# Patient Record
Sex: Male | Born: 1965 | Race: White | Hispanic: No | Marital: Married | Smoking: Current every day smoker
Health system: Southern US, Community
[De-identification: ages and names within clinical notes are randomized; demographics above are authoritative.]

## PROBLEM LIST (undated history)

## (undated) DIAGNOSIS — K219 Gastro-esophageal reflux disease without esophagitis: Secondary | ICD-10-CM

## (undated) DIAGNOSIS — R51 Headache: Secondary | ICD-10-CM

## (undated) DIAGNOSIS — T7840XA Allergy, unspecified, initial encounter: Secondary | ICD-10-CM

## (undated) DIAGNOSIS — R0601 Orthopnea: Secondary | ICD-10-CM

## (undated) HISTORY — PX: ESOPHAGOGASTRODUODENOSCOPY: SHX1529

## (undated) HISTORY — PX: TONSILLECTOMY AND ADENOIDECTOMY: SUR1326

## (undated) HISTORY — DX: Allergy, unspecified, initial encounter: T78.40XA

---

## 2001-12-25 ENCOUNTER — Emergency Department (HOSPITAL_COMMUNITY): Admission: EM | Admit: 2001-12-25 | Discharge: 2001-12-25 | Payer: Self-pay | Admitting: Emergency Medicine

## 2001-12-25 ENCOUNTER — Encounter: Payer: Self-pay | Admitting: Emergency Medicine

## 2004-05-14 ENCOUNTER — Encounter: Admission: RE | Admit: 2004-05-14 | Discharge: 2004-05-14 | Payer: Self-pay | Admitting: Family Medicine

## 2013-09-08 ENCOUNTER — Ambulatory Visit (INDEPENDENT_AMBULATORY_CARE_PROVIDER_SITE_OTHER): Payer: BC Managed Care – PPO | Admitting: Emergency Medicine

## 2013-09-08 VITALS — BP 110/70 | HR 66 | Temp 98.0°F | Resp 18 | Ht 67.0 in | Wt 220.0 lb

## 2013-09-08 DIAGNOSIS — R05 Cough: Secondary | ICD-10-CM

## 2013-09-08 DIAGNOSIS — R059 Cough, unspecified: Secondary | ICD-10-CM

## 2013-09-08 DIAGNOSIS — R52 Pain, unspecified: Secondary | ICD-10-CM

## 2013-09-08 DIAGNOSIS — J329 Chronic sinusitis, unspecified: Secondary | ICD-10-CM

## 2013-09-08 DIAGNOSIS — J029 Acute pharyngitis, unspecified: Secondary | ICD-10-CM

## 2013-09-08 LAB — POCT INFLUENZA A/B
Influenza A, POC: NEGATIVE
Influenza B, POC: NEGATIVE

## 2013-09-08 LAB — POCT RAPID STREP A (OFFICE): Rapid Strep A Screen: NEGATIVE

## 2013-09-08 MED ORDER — FLUTICASONE PROPIONATE 50 MCG/ACT NA SUSP: 2.0000 | Freq: Every day | NASAL | Status: DC

## 2013-09-08 MED ORDER — AZITHROMYCIN 250 MG PO TABS: ORAL_TABLET | ORAL | Status: DC

## 2013-09-08 NOTE — Patient Instructions (Signed)

## 2013-09-08 NOTE — Progress Notes (Signed)
   Subjective:    Patient ID: Barry Jackson, male    DOB: 21-Jul-1966, 47 y.o.   MRN: [id number]  HPI  Patient presents today with sinus congestion, sore throat. He uses the saline nasal spray OTC. He is have problems when he is sleeping. He has had chills last night. No coughing he has a history of sinusitis. The symptoms all began 2 days ago.    Review of Systems     Objective:   Physical Exam patient is alert and cooperative. He is ill-appearing but not toxic his TMs on the right there appears to be some fluid left TM is normal. Nasal exam reveals significant turbinate congestion with some purulence. The posterior pharynx is red there is tenderness over the anterior cervical nodes but no definite enlargement. There is no stridor noted. Breath sounds are symmetrical no dullness. Cardiac reveals a regular rate no murmurs. Results for orders placed in visit on 09/08/13  POCT INFLUENZA A/B      Result Value Range   Influenza A, POC Negative     Influenza B, POC Negative    POCT RAPID STREP A (OFFICE)      Result Value Range   Rapid Strep A Screen Negative  Negative         Assessment & Plan:  Strep culture and strep screen were done flu swab done. These are both negative we'll treat with a Z-Pak and Flonase.

## 2013-09-10 LAB — CULTURE, GROUP A STREP: Organism ID, Bacteria: NORMAL

## 2013-10-02 ENCOUNTER — Ambulatory Visit: Payer: BC Managed Care – PPO

## 2013-10-02 ENCOUNTER — Ambulatory Visit (INDEPENDENT_AMBULATORY_CARE_PROVIDER_SITE_OTHER): Payer: BC Managed Care – PPO | Admitting: Family Medicine

## 2013-10-02 ENCOUNTER — Encounter: Payer: Self-pay | Admitting: Family Medicine

## 2013-10-02 VITALS — BP 110/62 | HR 52 | Temp 98.8°F | Resp 16 | Ht 67.0 in | Wt 222.0 lb

## 2013-10-02 DIAGNOSIS — R05 Cough: Secondary | ICD-10-CM

## 2013-10-02 DIAGNOSIS — R059 Cough, unspecified: Secondary | ICD-10-CM

## 2013-10-02 DIAGNOSIS — R0602 Shortness of breath: Secondary | ICD-10-CM

## 2013-10-02 DIAGNOSIS — R0601 Orthopnea: Secondary | ICD-10-CM

## 2013-10-02 DIAGNOSIS — J441 Chronic obstructive pulmonary disease with (acute) exacerbation: Secondary | ICD-10-CM

## 2013-10-02 DIAGNOSIS — F17200 Nicotine dependence, unspecified, uncomplicated: Secondary | ICD-10-CM

## 2013-10-02 LAB — POCT CBC
Granulocyte percent: 66.4 %G (ref 37–80)
HCT, POC: 49 % (ref 43.5–53.7)
Hemoglobin: 15.5 g/dL (ref 14.1–18.1)
Lymph, poc: 2.1 (ref 0.6–3.4)
MCH, POC: 30.8 pg (ref 27–31.2)
MCHC: 31.6 g/dL — AB (ref 31.8–35.4)
MCV: 97.2 fL — AB (ref 80–97)
MID (cbc): 0.6 (ref 0–0.9)
MPV: 7.8 fL (ref 0–99.8)
POC Granulocyte: 5.4 (ref 2–6.9)
POC LYMPH PERCENT: 26 %L (ref 10–50)
POC MID %: 7.6 %M (ref 0–12)
Platelet Count, POC: 318 10*3/uL (ref 142–424)
RBC: 5.04 M/uL (ref 4.69–6.13)
RDW, POC: 12.7 %
WBC: 8.1 10*3/uL (ref 4.6–10.2)

## 2013-10-02 LAB — BASIC METABOLIC PANEL
BUN: 14 mg/dL (ref 6–23)
CHLORIDE: 102 meq/L (ref 96–112)
CO2: 27 meq/L (ref 19–32)
CREATININE: 0.96 mg/dL (ref 0.50–1.35)
Calcium: 9.8 mg/dL (ref 8.4–10.5)
Glucose, Bld: 88 mg/dL (ref 70–99)
Potassium: 4.6 mEq/L (ref 3.5–5.3)
Sodium: 137 mEq/L (ref 135–145)

## 2013-10-02 LAB — POCT SEDIMENTATION RATE: POCT SED RATE: 37 mm/hr — AB (ref 0–22)

## 2013-10-02 MED ORDER — PREDNISONE 20 MG PO TABS: 40.0000 mg | ORAL_TABLET | Freq: Every day | ORAL | Status: DC

## 2013-10-02 MED ORDER — ALBUTEROL SULFATE HFA 108 (90 BASE) MCG/ACT IN AERS: 2.0000 | INHALATION_SPRAY | RESPIRATORY_TRACT | Status: DC | PRN

## 2013-10-02 MED ORDER — DOXYCYCLINE HYCLATE 100 MG PO CAPS: 100.0000 mg | ORAL_CAPSULE | Freq: Two times a day (BID) | ORAL | Status: DC

## 2013-10-02 NOTE — Patient Instructions (Signed)
Acute Bronchitis Bronchitis is inflammation of the airways that extend from the windpipe into the lungs (bronchi). The inflammation often causes mucus to develop. This leads to a cough, which is the most common symptom of bronchitis.  In acute bronchitis, the condition usually develops suddenly and goes away over time, usually in a couple weeks. Smoking, allergies, and asthma can make bronchitis worse. Repeated episodes of bronchitis may cause further lung problems.  CAUSES Acute bronchitis is most often caused by the same virus that causes a cold. The virus can spread from person to person (contagious).  SIGNS AND SYMPTOMS   Cough.   Fever.   Coughing up mucus.   Body aches.   Chest congestion.   Chills.   Shortness of breath.   Sore throat.  DIAGNOSIS  Acute bronchitis is usually diagnosed through a physical exam. Tests, such as chest X-rays, are sometimes done to rule out other conditions.  TREATMENT  Acute bronchitis usually goes away in a couple weeks. Often times, no medical treatment is necessary. Medicines are sometimes given for relief of fever or cough. Antibiotics are usually not needed but may be prescribed in certain situations. In some cases, an inhaler may be recommended to help reduce shortness of breath and control the cough. A cool mist vaporizer may also be used to help thin bronchial secretions and make it easier to clear the chest.  HOME CARE INSTRUCTIONS  Get plenty of rest.   Drink enough fluids to keep your urine clear or pale yellow (unless you have a medical condition that requires fluid restriction). Increasing fluids may help thin your secretions and will prevent dehydration.   Only take over-the-counter or prescription medicines as directed by your health care provider.   Avoid smoking and secondhand smoke. Exposure to cigarette smoke or irritating chemicals will make bronchitis worse. If you are a smoker, consider using nicotine gum or skin  patches to help control withdrawal symptoms. Quitting smoking will help your lungs heal faster.   Reduce the chances of another bout of acute bronchitis by washing your hands frequently, avoiding people with cold symptoms, and trying not to touch your hands to your mouth, nose, or eyes.   Follow up with your health care provider as directed.  SEEK MEDICAL CARE IF: Your symptoms do not improve after 1 week of treatment.  SEEK IMMEDIATE MEDICAL CARE IF:  You develop an increased fever or chills.   You have chest pain.   You have severe shortness of breath.  You have bloody sputum.   You develop dehydration.  You develop fainting.  You develop repeated vomiting.  You develop a severe headache. MAKE SURE YOU:   Understand these instructions.  Will watch your condition.  Will get help right away if you are not doing well or get worse. Document Released: 10/15/2004 Document Revised: 05/10/2013 Document Reviewed: 02/28/2013 [facility] Patient Information 2014 [place], [place].   Smoking Cessation Quitting smoking is important to your health and has many advantages. However, it is not always easy to quit since nicotine is a very addictive drug. Often times, people try 3 times or more before being able to quit. This document explains the best ways for you to prepare to quit smoking. Quitting takes hard work and a lot of effort, but you can do it. ADVANTAGES OF QUITTING SMOKING  You will live longer, feel better, and live better.  Your body will feel the impact of quitting smoking almost immediately.  Within 20 minutes, blood pressure decreases. Your  pulse returns to its normal level.  After 8 hours, carbon monoxide levels in the blood return to normal. Your oxygen level increases.  After 24 hours, the chance of having a heart attack starts to decrease. Your breath, hair, and body stop smelling like smoke.  After 48 hours, damaged nerve endings begin to recover. Your sense  of taste and smell improve.  After 72 hours, the body is virtually free of nicotine. Your bronchial tubes relax and breathing becomes easier.  After 2 to 12 weeks, lungs can hold more air. Exercise becomes easier and circulation improves.  The risk of having a heart attack, stroke, cancer, or lung disease is greatly reduced.  After 1 year, the risk of coronary heart disease is cut in half.  After 5 years, the risk of stroke falls to the same as a nonsmoker.  After 10 years, the risk of lung cancer is cut in half and the risk of other cancers decreases significantly.  After 15 years, the risk of coronary heart disease drops, usually to the level of a nonsmoker.  If you are pregnant, quitting smoking will improve your chances of having a healthy baby.  The people you live with, especially any children, will be healthier.  You will have extra money to spend on things other than cigarettes. QUESTIONS TO THINK ABOUT BEFORE ATTEMPTING TO QUIT You may want to talk about your answers with your caregiver.  Why do you want to quit?  If you tried to quit in the past, what helped and what did not?  What will be the most difficult situations for you after you quit? How will you plan to handle them?  Who can help you through the tough times? Your family? Friends? A caregiver?  What pleasures do you get from smoking? What ways can you still get pleasure if you quit? Here are some questions to ask your caregiver:  How can you help me to be successful at quitting?  What medicine do you think would be best for me and how should I take it?  What should I do if I need more help?  What is smoking withdrawal like? How can I get information on withdrawal? GET READY  Set a quit date.  Change your environment by getting rid of all cigarettes, ashtrays, matches, and lighters in your home, car, or work. Do not let people smoke in your home.  Review your past attempts to quit. Think about what  worked and what did not. GET SUPPORT AND ENCOURAGEMENT You have a better chance of being successful if you have help. You can get support in many ways.  Tell your family, friends, and co-workers that you are going to quit and need their support. Ask them not to smoke around you.  Get individual, group, or telephone counseling and support. Programs are available at [facility] and [facility]. Call your local health department for information about programs in your area.  Spiritual beliefs and practices may help some smokers quit.  Download a "quit meter" on your computer to keep track of quit statistics, such as how long you have gone without smoking, cigarettes not smoked, and money saved.  Get a self-help book about quitting smoking and staying off of tobacco. LEARN NEW SKILLS AND BEHAVIORS  Distract yourself from urges to smoke. Talk to someone, go for a walk, or occupy your time with a task.  Change your normal routine. Take a different route to work. Drink tea instead of coffee. Eat  breakfast in a different place.  Reduce your stress. Take a hot bath, exercise, or read a book.  Plan something enjoyable to do every day. Reward yourself for not smoking.  Explore interactive web-based programs that specialize in helping you quit. GET MEDICINE AND USE IT CORRECTLY Medicines can help you stop smoking and decrease the urge to smoke. Combining medicine with the above behavioral methods and support can greatly increase your chances of successfully quitting smoking.  Nicotine replacement therapy helps deliver nicotine to your body without the negative effects and risks of smoking. Nicotine replacement therapy includes nicotine gum, lozenges, inhalers, nasal sprays, and skin patches. Some may be available over-the-counter and others require a prescription.  Antidepressant medicine helps people abstain from smoking, but how this works is unknown. This medicine is available by  prescription.  Nicotinic receptor partial agonist medicine simulates the effect of nicotine in your brain. This medicine is available by prescription. Ask your caregiver for advice about which medicines to use and how to use them based on your health history. Your caregiver will tell you what side effects to look out for if you choose to be on a medicine or therapy. Carefully read the information on the package. Do not use any other product containing nicotine while using a nicotine replacement product.  RELAPSE OR DIFFICULT SITUATIONS Most relapses occur within the first 3 months after quitting. Do not be discouraged if you start smoking again. Remember, most people try several times before finally quitting. You may have symptoms of withdrawal because your body is used to nicotine. You may crave cigarettes, be irritable, feel very hungry, cough often, get headaches, or have difficulty concentrating. The withdrawal symptoms are only temporary. They are strongest when you first quit, but they will go away within 10 14 days. To reduce the chances of relapse, try to:  Avoid drinking alcohol. Drinking lowers your chances of successfully quitting.  Reduce the amount of caffeine you consume. Once you quit smoking, the amount of caffeine in your body increases and can give you symptoms, such as a rapid heartbeat, sweating, and anxiety.  Avoid smokers because they can make you want to smoke.  Do not let weight gain distract you. Many smokers will gain weight when they quit, usually less than 10 pounds. Eat a healthy diet and stay active. You can always lose the weight gained after you quit.  Find ways to improve your mood other than smoking. FOR MORE INFORMATION  www.smokefree.gov  Document Released: 09/01/2001 Document Revised: 03/08/2012 Document Reviewed: 12/17/2011 [facility] Patient Information 2014 [place], [place].

## 2013-10-02 NOTE — Progress Notes (Addendum)
This chart was scribed for Barry Sorenson, MD by Luisa Dago, ED Scribe. This patient was seen in room 3 and the patient's care was started at 4:15 PM. Subjective:    Patient ID: Barry Jackson, male    DOB: 1965/10/02, 48 y.o.   MRN: [id number] Chief Complaint  Patient presents with  . Sore Throat  . Shortness of Breath    he has a hard time breathing at night and now starting to have anxiety about it.    HPI HPI Comments: Barry Jackson is a 48 y.o. male who presents to [facility] complaining of orthopnea for the past several days - states he is gasping for air whenever he lays flat and so he has been completely unable to sleep the last few nights.  He states he has been ill for about a month. Diagnosed w/ a sinus infection and treated w/ a zpack sev wks ago.  His URI sxs resolved but sxs progressed into a productive cough with green sputum, sleep disturbances, mild wheezing, and palpitations and a gradual onset worsening sore throat.  He never got all the way well but then started sig worsening again 5 days ago.  Pt states that he has to drink a lot of water because his throat is constantly dry.  Pt denies taking any OTC medication to relieve his current symptoms. He never really used the flonse prev rx'ed. Pt is a smoker, he reports smoking 4-5 cigarettes daily for the past 30 years.    History reviewed. No pertinent past medical history. No Known Allergies No current outpatient prescriptions on file prior to visit.   No current facility-administered medications on file prior to visit.    Review of Systems  Constitutional: Positive for activity change, appetite change and fatigue. Negative for fever and chills.  HENT: Positive for sore throat. Negative for congestion, ear discharge, ear pain, postnasal drip, rhinorrhea, sinus pressure, sneezing and trouble swallowing.   Respiratory: Positive for cough, shortness of breath and wheezing (mild).   Cardiovascular:  Positive for palpitations. Negative for chest pain and leg swelling.  Gastrointestinal: Negative for nausea, vomiting, abdominal pain, diarrhea and constipation.  Genitourinary: Negative for dysuria and difficulty urinating.  Musculoskeletal: Positive for arthralgias. Negative for myalgias, neck pain and neck stiffness.  Hematological: Negative for adenopathy.  Psychiatric/Behavioral: Positive for sleep disturbance.      Triage Vitals: BP 110/62  Pulse 52  Temp(Src) 98.8 F (37.1 C) (Oral)  Resp 16  Ht 5\' 7"  (1.702 m)  Wt 222 lb (100.699 kg)  BMI 34.76 kg/m2  SpO2 96% Objective:   Physical Exam  Nursing note and vitals reviewed. Constitutional: He is oriented to person, place, and time. He appears well-developed and well-nourished.  HENT:  Head: Normocephalic and atraumatic.  Right Ear: Tympanic membrane and ear canal normal.  Left Ear: Tympanic membrane and ear canal normal.  Nose: Rhinorrhea present. No mucosal edema.  Mouth/Throat: Uvula is midline and mucous membranes are normal. No trismus in the jaw. No uvula swelling. Posterior oropharyngeal edema and posterior oropharyngeal erythema present. No oropharyngeal exudate.  Nose with mucosal erythema.  White streaking from post nasal drip in oropharynx.  Small posterior oropharynx.  Cardiovascular: Normal rate, regular rhythm, S1 normal, S2 normal and normal heart sounds.  Exam reveals no gallop and no friction rub.   No murmur heard. Pulmonary/Chest: Effort normal and breath sounds normal. No respiratory distress. He has no wheezes. He has no rales.  Abdominal: He exhibits  no distension.  Lymphadenopathy:       Head (right side): No submandibular, no tonsillar, no preauricular and no posterior auricular adenopathy present.       Head (left side): No submandibular, no tonsillar, no preauricular and no posterior auricular adenopathy present.    He has no cervical adenopathy.       Right: No supraclavicular adenopathy present.        Left: No supraclavicular adenopathy present.  Neurological: He is alert and oriented to person, place, and time.  Skin: Skin is warm and dry.  Psychiatric: He has a normal mood and affect.   Results for orders placed in visit on 10/02/13  POCT CBC      Result Value Range   WBC 8.1  4.6 - 10.2 K/uL   Lymph, poc 2.1  0.6 - 3.4   POC LYMPH PERCENT 26.0  10 - 50 %L   MID (cbc) 0.6  0 - 0.9   POC MID % 7.6  0 - 12 %M   POC Granulocyte 5.4  2 - 6.9   Granulocyte percent 66.4  37 - 80 %G   RBC 5.04  4.69 - 6.13 M/uL   Hemoglobin 15.5  14.1 - 18.1 g/dL   HCT, POC 40.949.0  81.143.5 - 53.7 %   MCV 97.2 (*) 80 - 97 fL   MCH, POC 30.8  27 - 31.2 pg   MCHC 31.6 (*) 31.8 - 35.4 g/dL   RDW, POC 91.412.7     Platelet Count, POC 318  142 - 424 K/uL   MPV 7.8  0 - 99.8 fL   Primary reading by Dr. Clelia CroftShaw Chest X-ray:  Diffuse increase in interstitial markings. Borderline cardiomegaly  EKG: Normal sinus rhythm, no acute ischemic changes   EXAM: CHEST 2 VIEW  COMPARISON: No comparisons  FINDINGS: Normal mediastinum and cardiac silhouette. Normal pulmonary vasculature. No evidence of effusion, infiltrate, or pneumothorax. Lateral projection demonstrates coarsened central bronchovascular markings. No acute bony abnormality.  IMPRESSION: Coarsened central bronchovascular markings may represent viral process. No focal infiltrate.    Assessment & Plan:  5:01 PM-  SOB (shortness of breath) - Plan: EKG 12-Lead, POCT CBC, POCT SEDIMENTATION RATE, Basic metabolic panel  Orthopnea  Cough - Plan: DG Chest 2 View  Tobacco use disorder - strongly encouraged cessation - pt will consider and will try to quit.  COPD exacerbation - Peak flow normal but may benefit from trial of inhaler. If sxs do not immed improve on prednisone - rec repeat clinical eval to consider additional cardiac eval and cons sleep study.  Meds ordered this encounter  Medications  . predniSONE (DELTASONE) 20 MG tablet    Sig:  Take 2 tablets (40 mg total) by mouth daily.    Dispense:  10 tablet    Refill:  0  . doxycycline (VIBRAMYCIN) 100 MG capsule    Sig: Take 1 capsule (100 mg total) by mouth 2 (two) times daily.    Dispense:  20 capsule    Refill:  0  . albuterol (PROVENTIL HFA;VENTOLIN HFA) 108 (90 BASE) MCG/ACT inhaler    Sig: Inhale 2 puffs into the lungs every 4 (four) hours as needed for wheezing or shortness of breath (cough, shortness of breath or wheezing.).    Dispense:  1 Inhaler    Refill:  1    I personally performed the services described in this documentation, which was scribed in my presence. The recorded information has been reviewed and considered, and addended  by me as needed.  Barry Sorenson, MD MPH

## 2013-10-03 ENCOUNTER — Encounter: Payer: Self-pay | Admitting: Family Medicine

## 2013-10-10 ENCOUNTER — Emergency Department (HOSPITAL_COMMUNITY): Payer: BC Managed Care – PPO

## 2013-10-10 ENCOUNTER — Encounter (HOSPITAL_COMMUNITY): Payer: Self-pay | Admitting: Emergency Medicine

## 2013-10-10 ENCOUNTER — Observation Stay (HOSPITAL_COMMUNITY)
Admission: EM | Admit: 2013-10-10 | Discharge: 2013-10-11 | Disposition: A | Discharge status: Home / Self Care | Payer: BC Managed Care – PPO | Attending: Internal Medicine | Admitting: Internal Medicine

## 2013-10-10 DIAGNOSIS — K219 Gastro-esophageal reflux disease without esophagitis: Secondary | ICD-10-CM | POA: Diagnosis present

## 2013-10-10 DIAGNOSIS — R0601 Orthopnea: Secondary | ICD-10-CM

## 2013-10-10 DIAGNOSIS — R079 Chest pain, unspecified: Secondary | ICD-10-CM

## 2013-10-10 DIAGNOSIS — F17200 Nicotine dependence, unspecified, uncomplicated: Secondary | ICD-10-CM | POA: Insufficient documentation

## 2013-10-10 DIAGNOSIS — R0789 Other chest pain: Principal | ICD-10-CM | POA: Insufficient documentation

## 2013-10-10 DIAGNOSIS — Z79899 Other long term (current) drug therapy: Secondary | ICD-10-CM | POA: Insufficient documentation

## 2013-10-10 HISTORY — DX: Gastro-esophageal reflux disease without esophagitis: K21.9

## 2013-10-10 HISTORY — DX: Headache: R51

## 2013-10-10 HISTORY — DX: Orthopnea: R06.01

## 2013-10-10 LAB — CBC
HEMATOCRIT: 44.4 % (ref 39.0–52.0)
Hemoglobin: 16 g/dL (ref 13.0–17.0)
MCH: 32.1 pg (ref 26.0–34.0)
MCHC: 36 g/dL (ref 30.0–36.0)
MCV: 89 fL (ref 78.0–100.0)
Platelets: 271 10*3/uL (ref 150–400)
RBC: 4.99 MIL/uL (ref 4.22–5.81)
RDW: 12.3 % (ref 11.5–15.5)
WBC: 8 10*3/uL (ref 4.0–10.5)

## 2013-10-10 LAB — BASIC METABOLIC PANEL
BUN: 12 mg/dL (ref 6–23)
CHLORIDE: 101 meq/L (ref 96–112)
CO2: 22 meq/L (ref 19–32)
CREATININE: 1.01 mg/dL (ref 0.50–1.35)
Calcium: 8.7 mg/dL (ref 8.4–10.5)
GFR calc Af Amer: >90 mL/min (ref >90)
GFR calc non Af Amer: 87 mL/min — ABNORMAL LOW (ref >90)
Glucose, Bld: 90 mg/dL (ref 70–99)
Potassium: 4.1 mEq/L (ref 3.7–5.3)
Sodium: 137 mEq/L (ref 137–147)

## 2013-10-10 LAB — PRO B NATRIURETIC PEPTIDE: PRO B NATRI PEPTIDE: 18.7 pg/mL (ref 0–125)

## 2013-10-10 LAB — POCT I-STAT TROPONIN I: Troponin i, poc: 0.01 ng/mL (ref 0.00–0.08)

## 2013-10-10 MED ORDER — NITROGLYCERIN 0.4 MG SL SUBL: 0.4000 mg | SUBLINGUAL_TABLET | SUBLINGUAL | Status: DC | PRN

## 2013-10-10 MED ORDER — PANTOPRAZOLE SODIUM 40 MG PO TBEC
40.0000 mg | DELAYED_RELEASE_TABLET | Freq: Every day | ORAL | Status: DC
  Administered 2013-10-11: 40 mg via ORAL
  Filled 2013-10-10: qty 1

## 2013-10-10 MED ORDER — ASPIRIN 81 MG PO CHEW
324.0000 mg | CHEWABLE_TABLET | Freq: Once | ORAL | Status: AC
  Administered 2013-10-10: 324 mg via ORAL
  Filled 2013-10-10: qty 4

## 2013-10-10 MED ORDER — LORAZEPAM 2 MG/ML IJ SOLN
1.0000 mg | Freq: Once | INTRAMUSCULAR | Status: AC
  Administered 2013-10-10: 1 mg via INTRAVENOUS
  Filled 2013-10-10: qty 1

## 2013-10-10 MED ORDER — ALPRAZOLAM 0.25 MG PO TABS
0.2500 mg | ORAL_TABLET | Freq: Two times a day (BID) | ORAL | Status: DC | PRN
  Administered 2013-10-10: 0.25 mg via ORAL
  Filled 2013-10-10: qty 1

## 2013-10-10 MED ORDER — ASPIRIN EC 325 MG PO TBEC
325.0000 mg | DELAYED_RELEASE_TABLET | Freq: Every day | ORAL | Status: DC
  Administered 2013-10-11: 325 mg via ORAL
  Filled 2013-10-10: qty 1

## 2013-10-10 MED ORDER — ONDANSETRON HCL 4 MG/2ML IJ SOLN: 4.0000 mg | Freq: Four times a day (QID) | INTRAMUSCULAR | Status: DC | PRN

## 2013-10-10 MED ORDER — ALBUTEROL SULFATE (2.5 MG/3ML) 0.083% IN NEBU: 3.0000 mL | INHALATION_SOLUTION | RESPIRATORY_TRACT | Status: DC | PRN

## 2013-10-10 MED ORDER — ACETAMINOPHEN 325 MG PO TABS
650.0000 mg | ORAL_TABLET | ORAL | Status: DC | PRN
Start: 2013-10-10 — End: 2013-10-11

## 2013-10-10 MED ORDER — SODIUM CHLORIDE 0.9 % IV SOLN: INTRAVENOUS | Status: DC

## 2013-10-10 MED ORDER — GI COCKTAIL ~~LOC~~: 30.0000 mL | Freq: Four times a day (QID) | ORAL | Status: DC | PRN

## 2013-10-10 MED ORDER — ENOXAPARIN SODIUM 40 MG/0.4ML ~~LOC~~ SOLN
40.0000 mg | SUBCUTANEOUS | Status: DC
  Administered 2013-10-10: 40 mg via SUBCUTANEOUS
  Filled 2013-10-10 (×2): qty 0.4

## 2013-10-10 MED ORDER — MORPHINE SULFATE 2 MG/ML IJ SOLN: 2.0000 mg | INTRAMUSCULAR | Status: DC | PRN

## 2013-10-10 NOTE — H&P (Signed)
PATIENT DETAILS Name: Barry Jackson Age: 48 y.o. Sex: male Date of Birth: [birth year] Admit Date: 10/10/2013 PCP:Pcp Not In System   CHIEF COMPLAINT:  Chest pain and shortness of breath for 4 weeks  HPI: Barry Jackson is a 48 y.o. male with a Past Medical History of gastroesophageal reflux disease who presents today with the above noted complaint. Per patient, for the past week he has been having chest pain which he describes as heaviness, without any radiation. There is no associated nausea, vomiting, diaphoresis. Pain is not related to any sort of physical activity. He however claims to have associated shortness of breath. He claims that the shortness of breath is mostly when he lies down, and he is to get up gasping for breath. He denies any exertional shortness of breath. He claims that he went to a local urgent care, and was diagnosed with bronchitis, and has completed a course of antibiotics, steroids and albuterol inhaler. He however continues to have the above symptoms, as a result he presented to the ED for further evaluation today. I was subsequently asked to admit this patient for further evaluation and treatment There is no history of headache, fever, neck pain, nausea, vomiting, diarrhea, abdominal pain.  ALLERGIES:  No Known Allergies  PAST MEDICAL HISTORY: History reviewed. No pertinent past medical history.  PAST SURGICAL HISTORY: History reviewed. No pertinent past surgical history.  MEDICATIONS AT HOME: Prior to Admission medications   Medication Sig Start Date End Date Taking? Authorizing Provider  albuterol (PROVENTIL HFA;VENTOLIN HFA) 108 (90 BASE) MCG/ACT inhaler Inhale 2 puffs into the lungs every 4 (four) hours as needed for wheezing or shortness of breath (cough, shortness of breath or wheezing.). 10/02/13  Yes Sherren Mocha, MD  doxycycline (VIBRAMYCIN) 100 MG capsule Take 1 capsule (100 mg total) by mouth 2 (two) times daily. 10/02/13  Yes Sherren Mocha, MD   ibuprofen (ADVIL,MOTRIN) 200 MG tablet Take 400-800 mg by mouth every 4 (four) hours as needed for moderate pain.   Yes Historical Provider, MD  omeprazole (PRILOSEC) 20 MG capsule Take 20 mg by mouth daily.   Yes Historical Provider, MD    FAMILY HISTORY: Grandfather-MI in his 66s  SOCIAL HISTORY:  reports that he has been smoking Cigarettes.  He has been smoking about 0.00 packs per day. He does not have any smokeless tobacco history on file. He reports that he does not drink alcohol or use illicit drugs.  REVIEW OF SYSTEMS:  Constitutional:   No  weight loss, night sweats,  Fevers, chills, fatigue.  HEENT:    No headaches, Difficulty swallowing,Tooth/dental problems,Sore throat,  No sneezing, itching, ear ache, nasal congestion, post nasal drip,   Cardio-vascular: No PND, swelling in lower extremities, anasarca,  dizziness, palpitations  GI:  No heartburn, indigestion, abdominal pain, nausea, vomiting, diarrhea, change in bowel habits, loss of appetite  Resp: No shortness of breath with exertion.  No excess mucus, no productive cough, No non-productive cough,  No coughing up of blood.No change in color of mucus.No wheezing.No chest wall deformity  Skin:  no rash or lesions.  GU:  no dysuria, change in color of urine, no urgency or frequency.  No flank pain.  Musculoskeletal: No joint pain or swelling.  No decreased range of motion.  No back pain.  Psych: No change in mood or affect. No depression or anxiety.  No memory loss.   PHYSICAL EXAM: Blood pressure 123/75, pulse 54, temperature 97.7 F (36.5 C), temperature source Oral,  resp. rate 17, height 5\' 8"  (1.727 m), weight 99.791 kg (220 lb), SpO2 100.00%.  General appearance :Awake, alert, not in any distress. Speech Clear. Not toxic Looking HEENT: Atraumatic and Normocephalic, pupils equally reactive to light and accomodation Neck: supple, no JVD. No cervical lymphadenopathy.  Chest:Good air entry bilaterally, no  added sounds  CVS: S1 S2 regular, no murmurs.  Abdomen: Bowel sounds present, Non tender and not distended with no gaurding, rigidity or rebound. Extremities: B/L Lower Ext shows no edema, both legs are warm to touch Neurology: Awake alert, and oriented X 3, CN II-XII intact, Non focal Skin:No Rash Wounds:N/A  LABS ON ADMISSION:   Recent Labs  10/10/13 1319  NA 137  K 4.1  CL 101  CO2 22  GLUCOSE 90  BUN 12  CREATININE 1.01  CALCIUM 8.7   No results found for this basename: AST, ALT, ALKPHOS, BILITOT, PROT, ALBUMIN,  in the last 72 hours No results found for this basename: LIPASE, AMYLASE,  in the last 72 hours  Recent Labs  10/10/13 1319  WBC 8.0  HGB 16.0  HCT 44.4  MCV 89.0  PLT 271   No results found for this basename: CKTOTAL, CKMB, CKMBINDEX, TROPONINI,  in the last 72 hours No results found for this basename: DDIMER,  in the last 72 hours No components found with this basename: POCBNP,    RADIOLOGIC STUDIES ON ADMISSION: Dg Chest 2 View  10/10/2013   CLINICAL DATA:  Shortness of breath for 1 month.  EXAM: CHEST  2 VIEW  COMPARISON:  05/14/2004  FINDINGS: The cardiac silhouette remains upper limits of normal in size, unchanged. The patient has taken a shallower inspiration than on the prior examination. There is no evidence of focal airspace consolidation, edema, pleural effusion, or pneumothorax. No acute osseous abnormality is identified.  IMPRESSION: No active cardiopulmonary disease.   Electronically Signed   By: Sebastian AcheAllen  Grady   On: 10/10/2013 14:59     EKG: Independently reviewed. Sinus bradycardia in the 50s.  ASSESSMENT AND PLAN: Present on Admission:  . Chest pain - With both typical and atypical features. Suspect some anxiety component here. - Will admit to telemetry unit, cycle cardiac enzymes.  - Given persistent chest pain for 4 weeks, suspect he will benefit from inpatient stress test prior to discharge. We will keep him n.p.o. post midnight. We  need to consult cardiology in a.m. for stress test, or during the night if enzymes are positive. He will be placed on aspirin.   Marland Kitchen. ?Orthopnea - Shema CHF-however no other physical findings on exam. BNP only at 18.7.  - Check echo.Not hypoxic, no leg swelling-? Component of anxiety   . GERD (gastroesophageal reflux disease) - Continue PPI  Further plan will depend as patient's clinical course evolves and further radiologic and laboratory data become available. Patient will be monitored closely.  Above noted plan was discussed with patient,he was in agreement.   DVT Prophylaxis: Prophylactic Lovenox   Code Status: Full Code  Total time spent for admission equals 45 minutes.  [facility] LcGHIMIRE,SHANKER [facility] Pager [phone]  If 7PM-7AM, please contact night-coverage [email or web address] Password [facility] 10/10/2013, 6:37 PM

## 2013-10-10 NOTE — ED Notes (Signed)
Pt reports having sob for extended amount of time. Has been to [facility] and diagnosed with bronchitis and given antibiotics. Pt reports that he is almost finished with meds and no relief. Has increase in sob when he lays down in bed at night, which also causes anxiety and he is unable to sleep. ekg done at triage. Airway is intact.

## 2013-10-10 NOTE — ED Provider Notes (Signed)
TIME SEEN: 5:36 PM  CHIEF COMPLAINT: Shortness of breath, chest heaviness  HPI: Patient is a 48 year old male with a history of tobacco use and family history of a grandfather with MI at 32 years old who presents emergency department with one month of intermittent chest heaviness and shortness of breath. He states his symptoms are worse with exertion and also lying flat. He denies a prior history of cardiac disease. He's never had stress test. Denies a prior history of PE or DVT, lower extremity swelling or pain, prolonged immobilization, surgery, trauma, fracture. No history of cough or fever. He was seen in urgent care and given azithromycin and prednisone for possible bronchitis without relief. He denies any associated nausea, vomiting, diaphoresis or lightheadedness. He reports his symptoms are making him feel very anxious. He does not have a primary care Dr.  Linus Orn: See HPI Constitutional: no fever  Eyes: no drainage  ENT: no runny nose   Cardiovascular:  chest pain  Resp:  SOB  GI: no vomiting GU: no dysuria Integumentary: no rash  Allergy: no hives  Musculoskeletal: no leg swelling  Neurological: no slurred speech ROS otherwise negative  PAST MEDICAL HISTORY/PAST SURGICAL HISTORY:  History reviewed. No pertinent past medical history.  MEDICATIONS:  Prior to Admission medications   Medication Sig Start Date End Date Taking? Authorizing Provider  albuterol (PROVENTIL HFA;VENTOLIN HFA) 108 (90 BASE) MCG/ACT inhaler Inhale 2 puffs into the lungs every 4 (four) hours as needed for wheezing or shortness of breath (cough, shortness of breath or wheezing.). 10/02/13  Yes Sherren Mocha, MD  doxycycline (VIBRAMYCIN) 100 MG capsule Take 1 capsule (100 mg total) by mouth 2 (two) times daily. 10/02/13  Yes Sherren Mocha, MD  ibuprofen (ADVIL,MOTRIN) 200 MG tablet Take 400-800 mg by mouth every 4 (four) hours as needed for moderate pain.   Yes Historical Provider, MD  omeprazole (PRILOSEC) 20 MG capsule  Take 20 mg by mouth daily.   Yes Historical Provider, MD    ALLERGIES:  No Known Allergies  SOCIAL HISTORY:  History  Substance Use Topics  . Smoking status: Current Every Day Smoker    Types: Cigarettes  . Smokeless tobacco: Not on file  . Alcohol Use: No    FAMILY HISTORY: History reviewed. No pertinent family history.  EXAM: BP 124/81  Pulse 56  Temp(Src) 97.7 F (36.5 C) (Oral)  Resp 15  Ht 5\' 8"  (1.727 m)  Wt 220 lb (99.791 kg)  BMI 33.46 kg/m2  SpO2 99% CONSTITUTIONAL: Alert and oriented and responds appropriately to questions. Well-appearing; well-nourished HEAD: Normocephalic EYES: Conjunctivae clear, PERRL ENT: normal nose; no rhinorrhea; moist mucous membranes; pharynx without lesions noted NECK: Supple, no meningismus, no LAD  CARD: RRR; S1 and S2 appreciated; no murmurs, no clicks, no rubs, no gallops RESP: Normal chest excursion without splinting or tachypnea; breath sounds clear and equal bilaterally; no wheezes, no rhonchi, no rales,  ABD/GI: Normal bowel sounds; non-distended; soft, non-tender, no rebound, no guarding BACK:  The back appears normal and is non-tender to palpation, there is no CVA tenderness EXT: Normal ROM in all joints; non-tender to palpation; no edema; normal capillary refill; no cyanosis    SKIN: Normal color for age and race; warm NEURO: Moves all extremities equally PSYCH: The patient's mood and manner are appropriate. Grooming and personal hygiene are appropriate. Appears anxious.  MEDICAL DECISION MAKING: Patient here with chest heaviness and shortness of breath. He has a history of tobacco use and family history of CAD.  His labs are unremarkable completing negative troponin. EKG shows no ischemic changes. Chest x-ray clear.  Will discuss with internal medicine for admission for ACS rule out. Will give aspirin, nitroglycerin.  ED PROGRESS: Discussed with hospitalist for admission for chest pain rule out.   EKG Interpretation     Date/Time:  Tuesday October 10 2013 17:25:54 EST Ventricular Rate:  55 PR Interval:  143 QRS Duration: 97 QT Interval:  427 QTC Calculation: 408 R Axis:   67 Text Interpretation:  Sinus rhythm Confirmed by WARD  DO, KRISTEN ([id number]) on 10/10/2013 5:50:16 PM             Layla MawKristen N Ward, DO 10/10/13 1801

## 2013-10-11 ENCOUNTER — Observation Stay (HOSPITAL_COMMUNITY): Payer: BC Managed Care – PPO

## 2013-10-11 DIAGNOSIS — R079 Chest pain, unspecified: Secondary | ICD-10-CM

## 2013-10-11 LAB — D-DIMER, QUANTITATIVE: D-Dimer, Quant: 0.4 ug/mL-FEU (ref 0.00–0.48)

## 2013-10-11 LAB — TROPONIN I
Troponin I: <0.3 ng/mL (ref <0.30)
Troponin I: <0.3 ng/mL (ref <0.30)

## 2013-10-11 MED ORDER — ALPRAZOLAM 0.25 MG PO TABS: 0.2500 mg | ORAL_TABLET | Freq: Two times a day (BID) | ORAL | Status: DC | PRN

## 2013-10-11 MED ORDER — TECHNETIUM TC 99M SESTAMIBI GENERIC - CARDIOLITE
10.0000 | Freq: Once | INTRAVENOUS | Status: AC | PRN
  Administered 2013-10-11: 10 via INTRAVENOUS

## 2013-10-11 MED ORDER — TECHNETIUM TC 99M SESTAMIBI GENERIC - CARDIOLITE
30.0000 | Freq: Once | INTRAVENOUS | Status: AC | PRN
  Administered 2013-10-11: 30 via INTRAVENOUS

## 2013-10-11 MED ORDER — ZOLPIDEM TARTRATE 5 MG PO TABS: 5.0000 mg | ORAL_TABLET | Freq: Every evening | ORAL | Status: DC | PRN

## 2013-10-11 NOTE — Discharge Summary (Signed)
Physician Discharge Summary  Barry Jackson:[id number] DOB: [birth year] DOA: 10/10/2013  PCP: No PCP Per Patient  Admit date: 10/10/2013 Discharge date: 10/11/2013  Time spent: 35 minutes  Recommendations for Outpatient Follow-up:  1. Need pulmonary function test and evaluation for obstructive sleep apnea.   Discharge Diagnoses:    Chest pain   GERD (gastroesophageal reflux disease)   Discharge Condition: Stable.   Diet recommendation: Health Healthy  Filed Weights   10/10/13 1316 10/11/13 0457  Weight: 99.791 kg (220 lb) 99.654 kg (219 lb 11.2 oz)    History of present illness:  Barry Jackson is a 48 y.o. male with a Past Medical History of gastroesophageal reflux disease who presents today with the above noted complaint. Per patient, for the past week he has been having chest pain which he describes as heaviness, without any radiation. There is no associated nausea, vomiting, diaphoresis. Pain is not related to any sort of physical activity. He however claims to have associated shortness of breath. He claims that the shortness of breath is mostly when he lies down, and he is to get up gasping for breath. He denies any exertional shortness of breath. He claims that he went to a local urgent care, and was diagnosed with bronchitis, and has completed a course of antibiotics, steroids and albuterol inhaler. He however continues to have the above symptoms, as a result he presented to the ED for further evaluation today. I was subsequently asked to admit this patient for further evaluation and treatment  There is no history of headache, fever, neck pain, nausea, vomiting, diarrhea, abdominal pain   Hospital Course:  1-Chest pain, dyspnea : patient was admitted to telemetry. Cardiac enzymes times 3 negative. Stress test negative. D dimer negative for PE. Unclear if anxiety component. He will be discahrge on xanax, close follow up with PCP. Explain to patient this shouldn't be long term  medication. Explain to him he will need further evaluation with pulmonary function test and evaluation for obstructive apnea.   Procedures:  Exercise tolerance test: Pre Test: No symptoms. VSS. EKG sinus brady without acute changes. During Test: Excellent exercise tolerance. 11:05 on Bruce protocol, reached target HR in stage 4 and held for 1 minute. C/o dyspnea at end. No cp or any other symptoms. EKG at peak exercise shows mild ST depression II, v4, more pronounced ST dep/biphasic T wave V3. No arrhythmia. Post Test: EKG returning to baseline. Symptoms resolved. VSS.  Consultations:  Cardiology  Discharge Exam: Filed Vitals:   10/11/13 1028  BP: 136/72  Pulse: 72  Temp:   Resp:     General: No distress.  Cardiovascular: S 1, S 2 RRR Respiratory: CTA  Discharge Instructions  Discharge Orders   Future Orders Complete By Expires   Diet - low sodium heart healthy  As directed    Increase activity slowly  As directed        Medication List    STOP taking these medications       doxycycline 100 MG capsule  Commonly known as:  VIBRAMYCIN      TAKE these medications       albuterol 108 (90 BASE) MCG/ACT inhaler  Commonly known as:  PROVENTIL HFA;VENTOLIN HFA  Inhale 2 puffs into the lungs every 4 (four) hours as needed for wheezing or shortness of breath (cough, shortness of breath or wheezing.).     ALPRAZolam 0.25 MG tablet  Commonly known as:  XANAX  Take 1 tablet (0.25 mg total)  by mouth 2 (two) times daily as needed for anxiety.     ibuprofen 200 MG tablet  Commonly known as:  ADVIL,MOTRIN  Take 400-800 mg by mouth every 4 (four) hours as needed for moderate pain.     omeprazole 20 MG capsule  Commonly known as:  PRILOSEC  Take 20 mg by mouth daily.       No Known Allergies     Follow-up Information   Follow up In 1 week. (follow up with PCP. )        The results of significant diagnostics from this hospitalization (including imaging,  microbiology, ancillary and laboratory) are listed below for reference.    Significant Diagnostic Studies: Dg Chest 2 View  10/10/2013   CLINICAL DATA:  Shortness of breath for 1 month.  EXAM: CHEST  2 VIEW  COMPARISON:  05/14/2004  FINDINGS: The cardiac silhouette remains upper limits of normal in size, unchanged. The patient has taken a shallower inspiration than on the prior examination. There is no evidence of focal airspace consolidation, edema, pleural effusion, or pneumothorax. No acute osseous abnormality is identified.  IMPRESSION: No active cardiopulmonary disease.   Electronically Signed   By: Sebastian AcheAllen  Grady   On: 10/10/2013 14:59   Dg Chest 2 View  10/02/2013   CLINICAL DATA:  cough, ShoB  EXAM: CHEST  2 VIEW  COMPARISON:  No comparisons  FINDINGS: Normal mediastinum and cardiac silhouette. Normal pulmonary vasculature. No evidence of effusion, infiltrate, or pneumothorax. Lateral projection demonstrates coarsened central bronchovascular markings. No acute bony abnormality.  IMPRESSION: Coarsened central bronchovascular markings may represent viral process. No focal infiltrate.   Electronically Signed   By: Genevive BiStewart  Edmunds M.D.   On: 10/02/2013 18:01   Nm Myocar Multi W/spect W/wall Motion / Ef  10/11/2013   CLINICAL DATA:  Chest pain  EXAM: MYOCARDIAL IMAGING WITH SPECT (REST AND EXERCISE)  GATED LEFT VENTRICULAR WALL MOTION STUDY  LEFT VENTRICULAR EJECTION FRACTION  TECHNIQUE: Standard myocardial SPECT imaging was performed after resting intravenous injection of 10 mCi [id number] sestamibi. Subsequently, exercise tolerance test was performed by the patient under the supervision of the Cardiology staff. At peak-stress, 30 mCi Tc-3043m sestamibi was injected intravenously and standard myocardial SPECT imaging was performed. Quantitative gated imaging was also performed to evaluate left ventricular wall motion, and estimate left ventricular ejection fraction.  COMPARISON:  None.  FINDINGS: The stress  SPECT images demonstrate physiologic distribution of radiopharmaceutical. Rest images demonstrate no perfusion defects.  The gated stress SPECT images demonstrate normal left ventricular myocardial thickening. No focal wall motion abnormality is seen.  Calculated left ventricular end-diastolic volume 84 mL, end-systolic volume 35 mL, ejection fraction of 58%.  IMPRESSION: 1. Negative for exercise induced ischemia.  2. Left ventricular ejection fraction 58%.   Electronically Signed   By: Charline BillsSriyesh  Krishnan M.D.   On: 10/11/2013 13:34    Microbiology: No results found for this or any previous visit (from the past 240 hour(s)).   Labs: Basic Metabolic Panel:  Recent Labs Lab 10/10/13 1319  NA 137  K 4.1  CL 101  CO2 22  GLUCOSE 90  BUN 12  CREATININE 1.01  CALCIUM 8.7   Liver Function Tests: No results found for this basename: AST, ALT, ALKPHOS, BILITOT, PROT, ALBUMIN,  in the last 168 hours No results found for this basename: LIPASE, AMYLASE,  in the last 168 hours No results found for this basename: AMMONIA,  in the last 168 hours CBC:  Recent Labs Lab 10/10/13  1319  WBC 8.0  HGB 16.0  HCT 44.4  MCV 89.0  PLT 271   Cardiac Enzymes:  Recent Labs Lab 10/10/13 2353 10/11/13 0530 10/11/13 1240  TROPONINI <0.30 <0.30 <0.30   BNP: BNP (last 3 results)  Recent Labs  10/10/13 1319  PROBNP 18.7   CBG: No results found for this basename: GLUCAP,  in the last 168 hours     Signed:  Nashawn Hillock  [facility] 10/11/2013, 8:14 PM

## 2013-10-11 NOTE — Progress Notes (Signed)
Exercised 11:05 on Bruce protocol with excellent exercise tolerance. Dyspnea at end of test. No CP. Nonspecific EKG change with exercise. No arrhythmias. Await images. Dayna Dunn PA-C

## 2013-10-11 NOTE — Progress Notes (Signed)
D/c orders received;IV removed with gauze on, pt remains in stable condition, pt meds and instructions reviewed and given to pt; pt d/c to home 

## 2013-10-11 NOTE — Progress Notes (Signed)
UR completed 

## 2013-10-11 NOTE — Consult Note (Signed)
CARDIOLOGY CONSULT NOTE   Patient ID: Barry Jackson MRN: [id number], DOB/AGE: 48-Aug-1967   Admit date: 10/10/2013 Date of Consult: 10/11/2013   Primary Physician: No PCP Per Patient Primary Cardiologist: None  Pt. Profile  Patient admitted with chest tightness and dyspnea.  Problem List  Past Medical History  Diagnosis Date  . Orthopnoea   . GERD (gastroesophageal reflux disease)   . [id number](478.2)     "maybe monthly" (10/10/2013)    Past Surgical History  Procedure Laterality Date  . Tonsillectomy and adenoidectomy  ~ 1978  . Esophagogastroduodenoscopy       Allergies  No Known Allergies  HPI   History of shortness of breath and dyspnea for the last month.  Has been to urgent care twice about this. He wonders if anxiety could be playing a role. History of snoring. Possible sleep apnea. Difficulty lying flat.  Cardiac enzymes negative overnight.  Inpatient Medications  . aspirin EC  325 mg Oral Daily  . enoxaparin (LOVENOX) injection  40 mg Subcutaneous Q24H  . pantoprazole  40 mg Oral Daily    Family History History reviewed. No pertinent family history.   Social History History   Social History  . Marital Status: Married    Spouse Name: N/A    Number of Children: N/A  . Years of Education: N/A   Occupational History  . Not on file.   Social History Main Topics  . Smoking status: Current Every Day Smoker -- 0.25 packs/day for 30 years    Types: Cigarettes  . Smokeless tobacco: Never Used  . Alcohol Use: 4.8 oz/week    8 Shots of liquor per week     Comment: 10/10/2013 "once/week; 3-4 drinks; probably 2 shots in each drink"  . Drug Use: Yes    Special: Marijuana     Comment: 10/10/2013 "in the 1980's and 1990's"  . Sexual Activity: Yes   Other Topics Concern  . Not on file   Social History Narrative  . No narrative on file     Review of Systems  General:  No chills, fever, night sweats or weight changes.  Cardiovascular:  No chest  pain, dyspnea on exertion, edema, orthopnea, palpitations, paroxysmal nocturnal dyspnea. Dermatological: No rash, lesions/masses Respiratory: Positive for dyspnea. Urologic: No hematuria, dysuria Abdominal:   No nausea, vomiting, diarrhea, bright red blood per rectum, melena, or hematemesis Neurologic:  No visual changes, wkns, changes in mental status. All other systems reviewed and are otherwise negative except as noted above.  Physical Exam  Blood pressure 113/69, pulse 55, temperature 97.7 F (36.5 C), temperature source Oral, resp. rate 16, height 5\' 8"  (1.727 m), weight 219 lb 11.2 oz (99.654 kg), SpO2 97.00%.  General: Pleasant, NAD Psych: Normal affect. Neuro: Alert and oriented X 3. Moves all extremities spontaneously. HEENT: Normal  Neck: Supple without bruits or JVD. Lungs:  Resp regular and unlabored, CTA. Heart: RRR no s3, s4, or murmurs. Abdomen: Soft, non-tender, non-distended, BS + x 4.  Extremities: No clubbing, cyanosis or edema. DP/PT/Radials 2+ and equal bilaterally.  Labs   Recent Labs  10/10/13 2353 10/11/13 0530  TROPONINI <0.30 <0.30   Lab Results  Component Value Date   WBC 8.0 10/10/2013   HGB 16.0 10/10/2013   HCT 44.4 10/10/2013   MCV 89.0 10/10/2013   PLT 271 10/10/2013     Recent Labs Lab 10/10/13 1319  NA 137  K 4.1  CL 101  CO2 22  BUN 12  CREATININE 1.01  CALCIUM 8.7  GLUCOSE 90   No results found for this basename: CHOL,  HDL,  LDLCALC,  TRIG   No results found for this basename: DDIMER    Radiology/Studies  Dg Chest 2 View  10/10/2013   CLINICAL DATA:  Shortness of breath for 1 month.  EXAM: CHEST  2 VIEW  COMPARISON:  05/14/2004  FINDINGS: The cardiac silhouette remains upper limits of normal in size, unchanged. The patient has taken a shallower inspiration than on the prior examination. There is no evidence of focal airspace consolidation, edema, pleural effusion, or pneumothorax. No acute osseous abnormality is identified.   IMPRESSION: No active cardiopulmonary disease.   Electronically Signed   By: Sebastian AcheAllen  Grady   On: 10/10/2013 14:59   Dg Chest 2 View  10/02/2013   CLINICAL DATA:  cough, ShoB  EXAM: CHEST  2 VIEW  COMPARISON:  No comparisons  FINDINGS: Normal mediastinum and cardiac silhouette. Normal pulmonary vasculature. No evidence of effusion, infiltrate, or pneumothorax. Lateral projection demonstrates coarsened central bronchovascular markings. No acute bony abnormality.  IMPRESSION: Coarsened central bronchovascular markings may represent viral process. No focal infiltrate.   Electronically Signed   By: Genevive BiStewart  Edmunds M.D.   On: 10/02/2013 18:01    ECG  Sinus bradycardia. No ischemic changes.  ASSESSMENT AND PLAN Chest pain with typical and atypical features.  Dyspnea with no objective findings of CHF or fluid overload.  Plan: Treadmill myoview today. If normal can be discharged later today with consideration of outpatient OSA workup. Consider trial of antianxiety agent.   Signed, Cassell Clementhomas Zanayah Shadowens, MD  10/11/2013, 7:52 AM

## 2014-11-24 IMAGING — CR DG CHEST 2V
2 series · 2 of 2 positions shown · non-contrast
Comparison: 05/14/2004

CLINICAL DATA: Shortness of breath for 1 month.

EXAM:
CHEST  2 VIEW

[w chest pa]
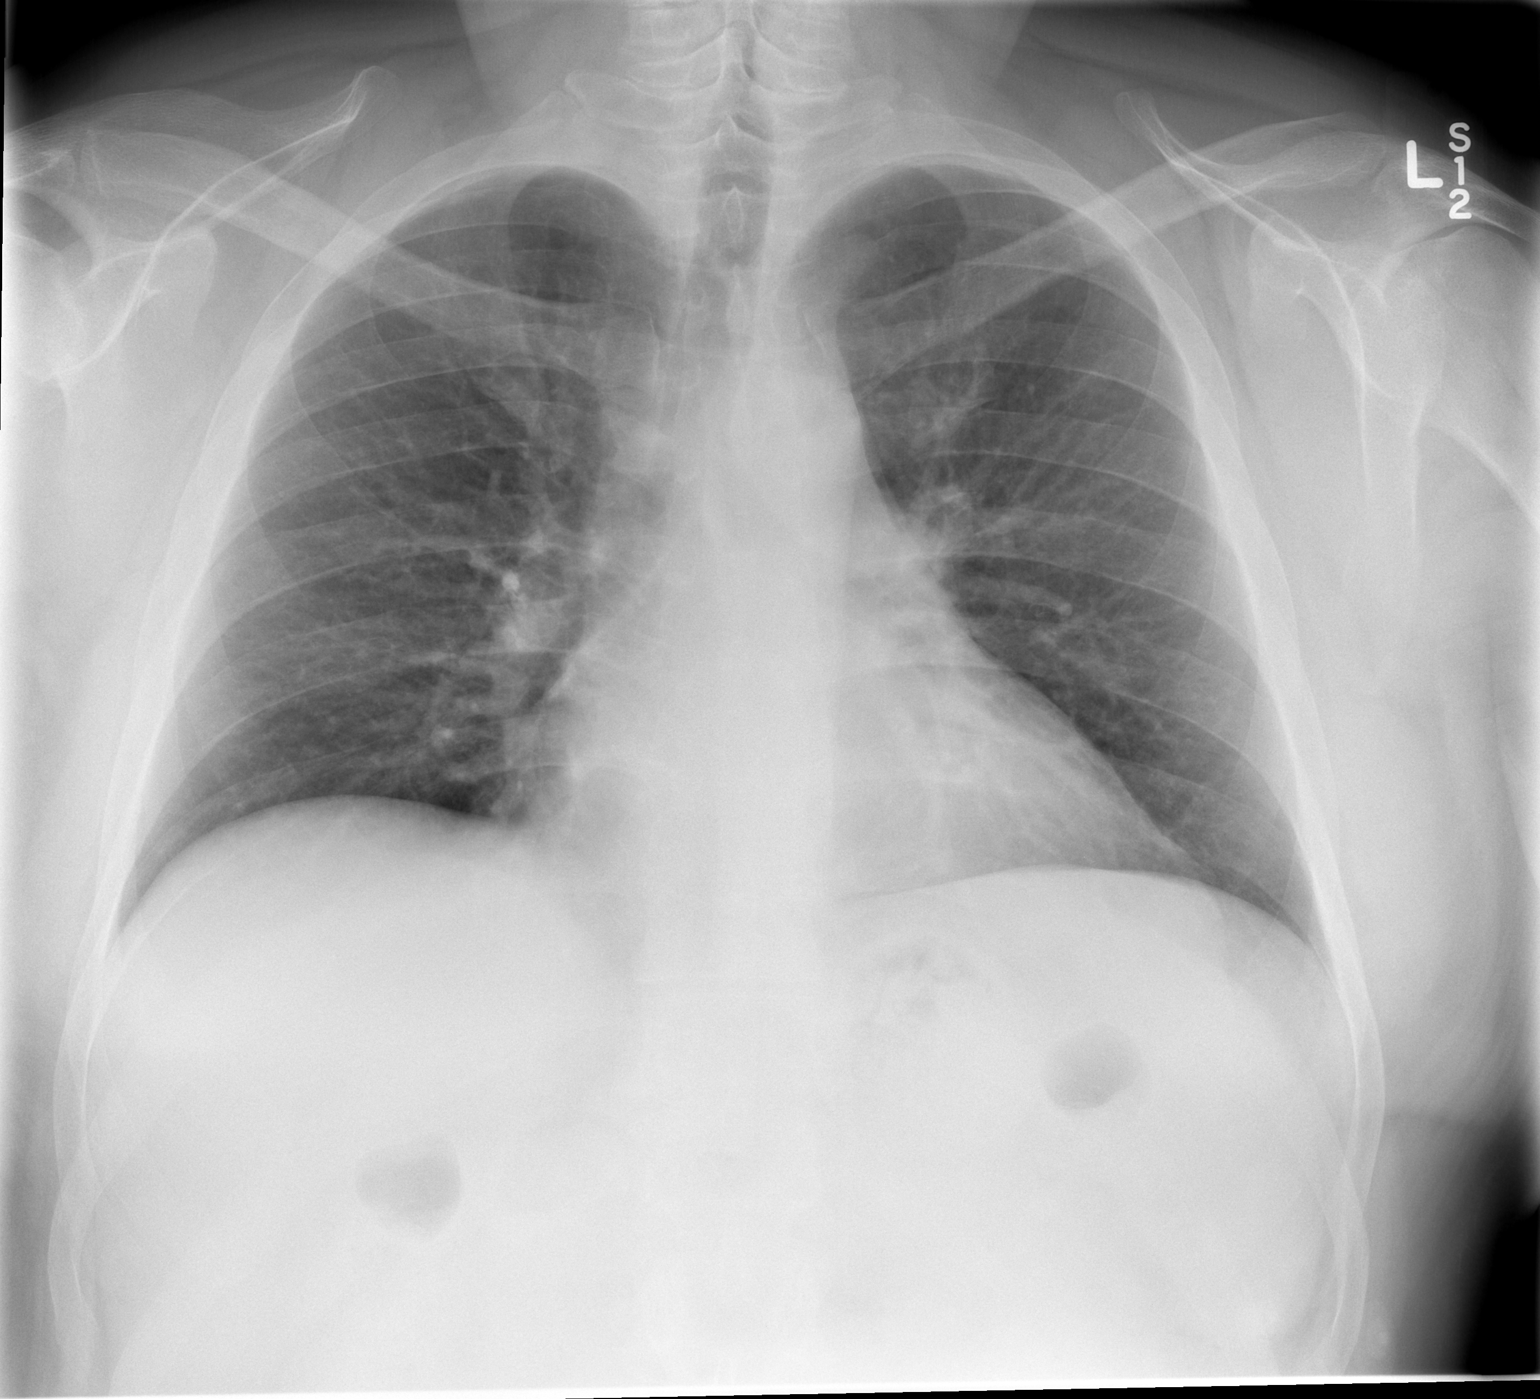

[w chest lat]
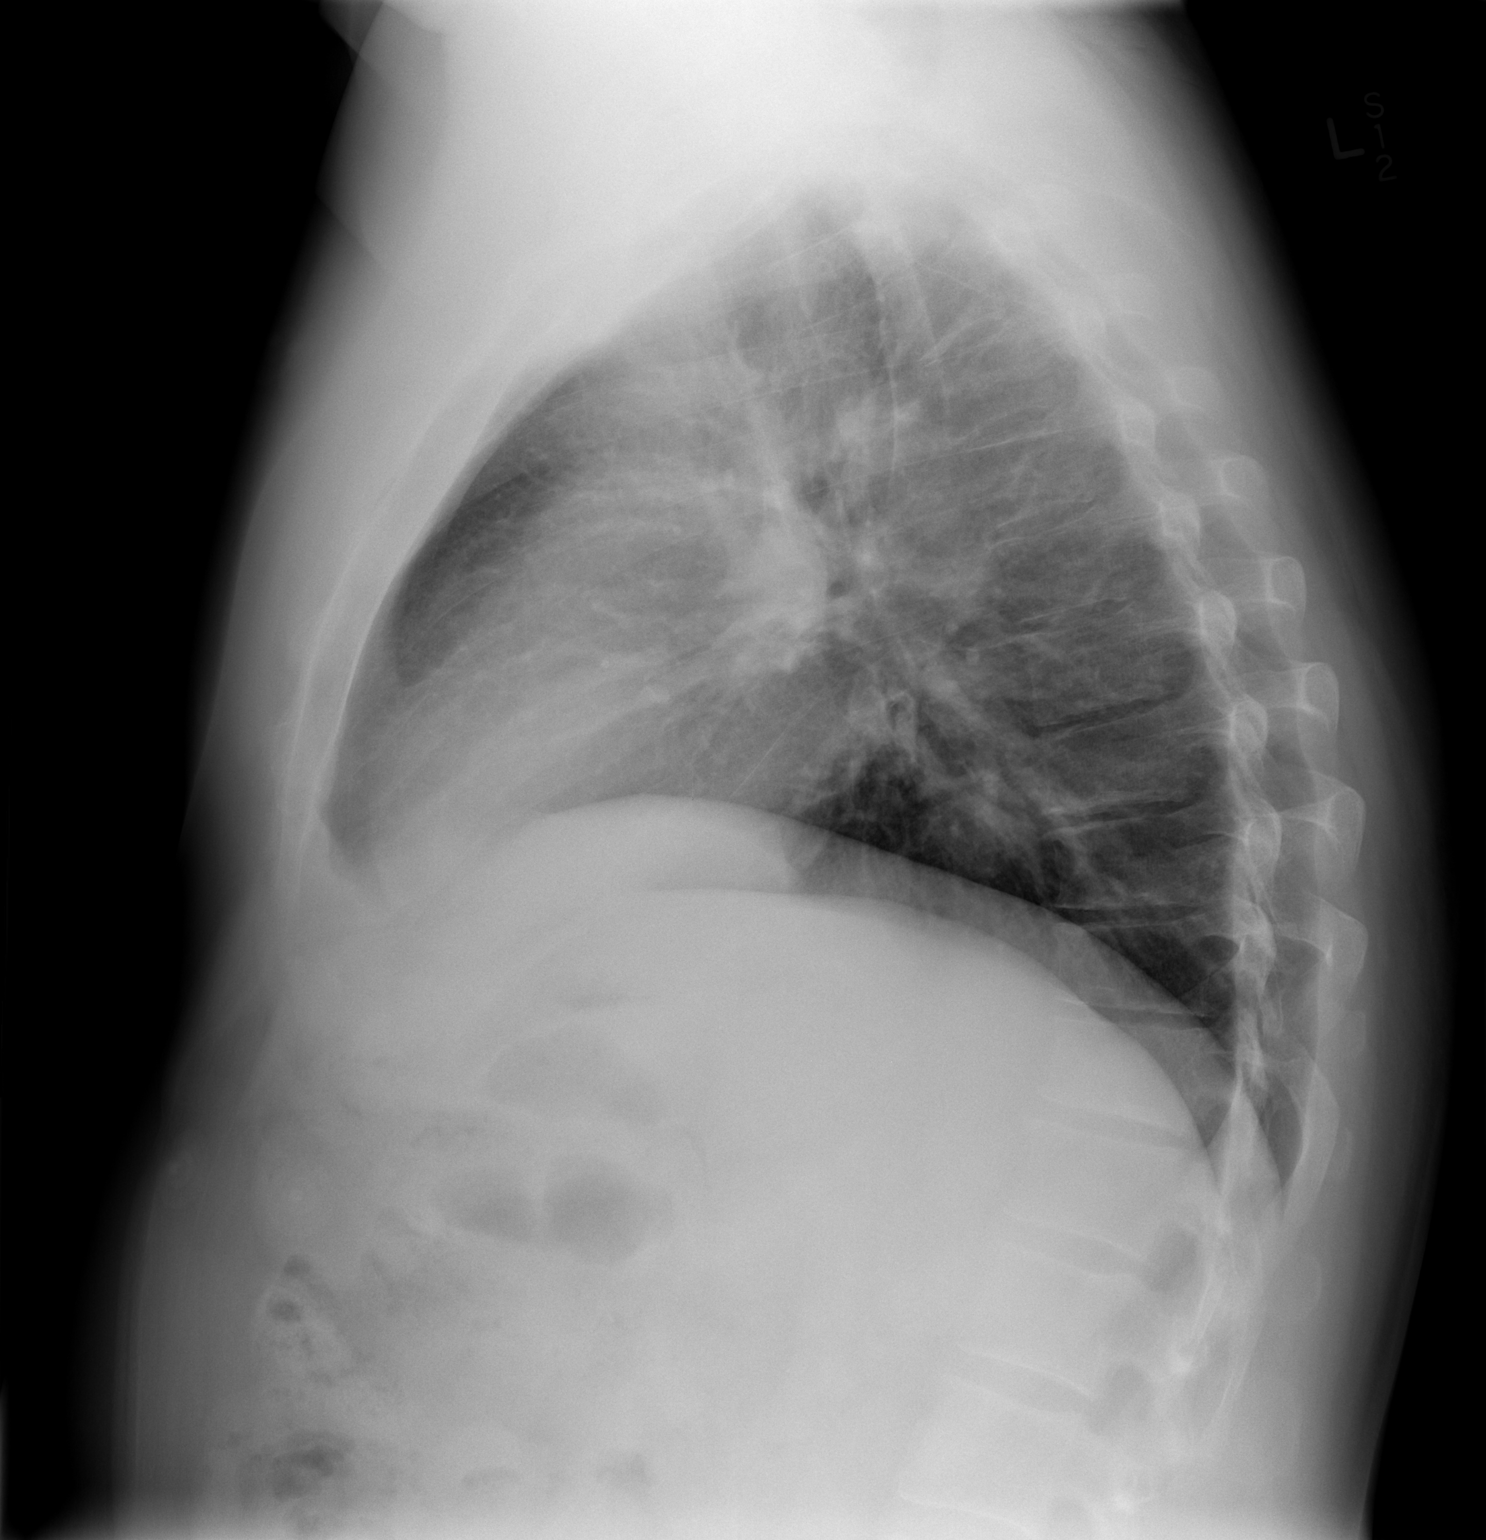

[2 of 2 positions shown; findings below may reference images not displayed]

FINDINGS: The cardiac silhouette remains upper limits of normal in size,
unchanged. The patient has taken a shallower inspiration than on the
prior examination. There is no evidence of focal airspace
consolidation, edema, pleural effusion, or pneumothorax. No acute
osseous abnormality is identified.
IMPRESSION: No active cardiopulmonary disease.

## 2020-04-22 DIAGNOSIS — J069 Acute upper respiratory infection, unspecified: Secondary | ICD-10-CM | POA: Diagnosis not present

## 2021-03-24 DIAGNOSIS — H9203 Otalgia, bilateral: Secondary | ICD-10-CM | POA: Diagnosis not present

## 2021-03-27 ENCOUNTER — Other Ambulatory Visit: Payer: Self-pay

## 2021-03-27 ENCOUNTER — Ambulatory Visit: Payer: BC Managed Care – PPO | Admitting: Orthopaedic Surgery

## 2021-03-27 ENCOUNTER — Ambulatory Visit: Payer: Self-pay

## 2021-03-27 ENCOUNTER — Encounter: Payer: Self-pay | Admitting: Orthopaedic Surgery

## 2021-03-27 DIAGNOSIS — M25562 Pain in left knee: Secondary | ICD-10-CM | POA: Diagnosis not present

## 2021-03-27 DIAGNOSIS — M7541 Impingement syndrome of right shoulder: Secondary | ICD-10-CM | POA: Diagnosis not present

## 2021-03-27 DIAGNOSIS — M2242 Chondromalacia patellae, left knee: Secondary | ICD-10-CM | POA: Insufficient documentation

## 2021-03-27 DIAGNOSIS — M25511 Pain in right shoulder: Secondary | ICD-10-CM

## 2021-03-27 DIAGNOSIS — G8929 Other chronic pain: Secondary | ICD-10-CM

## 2021-03-27 NOTE — Progress Notes (Signed)
Office Visit Note   Patient: Barry Jackson           Date of Birth: 1966-02-15           MRN: [id number] Visit Date: 03/27/2021              Requested by: No referring provider defined for this encounter. PCP: Patient, No Pcp Per (Inactive)   Assessment & Plan: Visit Diagnoses:  1. Chronic pain of left knee   2. Chronic right shoulder pain   3. Chondromalacia patellae, left knee   4. Impingement syndrome of right shoulder     Plan: Mr. Kossman is evaluated today for a problem with his left knee and right shoulder.  He has a chronic problem with his left knee and relates that he has difficulty maneuvering steps or stairs or even bending and stooping.  Every Tuesday and Thursday he walks a mile and runs a mile and then works out with [employer].  He only seems to have a problem when he does bending exercises with weights and experiences sharp pain along the anterior aspect of his knee.  He has not had any issues with numbness or tingling or sensation of his knee giving way.  He is not had any recent injury or trauma.  His symptoms are certainly consistent with chondromalacia patella and he is tender about the patella with compression knee particularly laterally where he might of lateral patella pressure syndrome.  There is some lateral patella tilt by x-ray.  We discussed use of Voltaren gel and quad exercises.  At some point we may want to consider cortisone injection but he was not ready for that today.  The other issue is with his right shoulder.  He has had trouble for about 6 to 7 months with an insidious onset of pain.  He does lift weights at least twice a week and finds it he has pain with overhead activity.  He is tender in the posterior lateral subacromial region consistent with impingement but no grating or crepitation and also has some pain over the [facility] joint with there is some sclerosis.  We talked about limiting overhead activities for at least a month or more and trying the Voltaren  gel and maybe even considering cortisone injection.  He will let me know  Follow-Up Instructions: Return if symptoms worsen or fail to improve.   Orders:  Orders Placed This Encounter  Procedures   XR KNEE 3 VIEW LEFT   XR Shoulder Right   No orders of the defined types were placed in this encounter.     Procedures: No procedures performed   Clinical Data: No additional findings.   Subjective: Chief Complaint  Patient presents with   Left Knee - Pain   Right Shoulder - Pain  Left knee pain on a chronic basis.  He had some injuries when he was playing sports in high school but relates that recently has not had any injury or trauma.  He is very active athletically and every Tuesday and Thursday he walks a mile runs a mile and then works out with [employer].  His pain seems to be localized along the anterior aspect of the knee in the area of the patella.  No sensation of his knee giving way or swelling.  Pain appears to be momentary but can be quite severe.  The other issue is with his right shoulder.  He has had trouble for least 6 to 7 months with pain particularly when  he lifts weights overhead.  Pain is localized anteriorly in the area of the [facility] joint and also in the posterior subacromial region.  No numbness or tingling or neck pain.  No obvious weakness.  No black or blue discoloration  HPI  Review of Systems   Objective: Vital Signs: There were no vitals taken for this visit.  Physical Exam Constitutional:      Appearance: He is well-developed.  Eyes:     Pupils: Pupils are equal, round, and reactive to light.  Pulmonary:     Effort: Pulmonary effort is normal.  Skin:    General: Skin is warm and dry.  Neurological:     Mental Status: He is alert and oriented to person, place, and time.  Psychiatric:        Behavior: Behavior normal.    Ortho Exam awake alert and oriented x3.  Comfortable sitting.  Left knee with pain directly over the patella laterally with  there is a little bit of crepitation.  Very minimal pain with patella compression.  No medial lateral joint pain.  No opening with a varus valgus stress.  Negative Lachman and anterior drawer sign.  No effusion.  No instability.  No popliteal pain or mass.  Right shoulder with tenderness directly over the [facility] joint where there is some hypertrophy.  Also has some pain in the posterior subacromial region at the angle.  No crepitation.  Negative impingement testing, empty can testing and speeds sign.  Good grip and release.  Specialty Comments:  No specialty comments available.  Imaging: XR KNEE 3 VIEW LEFT  Result Date: 03/27/2021 Films of the left knee were obtained in 3 projections standing.  Medial lateral joint spaces are well-maintained without ectopic calcification or irregularity.  There is lateral patella tilt and some hypertrophy of the lateral patella.  Patient is experiencing chondromalacia symptoms.  No evidence of an effusion.  No acute changes  XR Shoulder Right  Result Date: 03/27/2021 Films of the right shoulder obtained in 3 projections.  There is some sclerosis on both sides of the [facility] joint with some slight hypertrophy.  Patient is symptomatic in that area.  Normal space between the humeral head and the chromium.  Humeral head is centered in the glenoid.  No ectopic calcification.    PMFS History: Patient Active Problem List   Diagnosis Date Noted   Chondromalacia patellae, left knee 03/27/2021   Impingement syndrome of right shoulder 03/27/2021   Chest pain 10/10/2013   GERD (gastroesophageal reflux disease) 10/10/2013   Past Medical History:  Diagnosis Date   GERD (gastroesophageal reflux disease)    Headache(784.0)    "maybe monthly" (10/10/2013)   Orthopnoea     History reviewed. No pertinent family history.  Past Surgical History:  Procedure Laterality Date   ESOPHAGOGASTRODUODENOSCOPY     TONSILLECTOMY AND ADENOIDECTOMY  ~ 1978   Social History   Occupational  History   Not on file  Tobacco Use   Smoking status: Every Day    Packs/day: 0.25    Years: 30.00    Pack years: 7.50    Types: Cigarettes   Smokeless tobacco: Never  Substance and Sexual Activity   Alcohol use: Yes    Alcohol/week: 8.0 standard drinks    Types: 8 Shots of liquor per week    Comment: 10/10/2013 "once/week; 3-4 drinks; probably 2 shots in each drink"   Drug use: Yes    Types: Marijuana    Comment: 10/10/2013 "in the 1980's and 1990's"  Sexual activity: Yes     Valeria Batman, MD   Note - This record has been created using [employer].  Chart creation errors have been sought, but may not always  have been located. Such creation errors do not reflect on  the standard of medical care.

## 2021-04-01 DIAGNOSIS — Z1389 Encounter for screening for other disorder: Secondary | ICD-10-CM | POA: Diagnosis not present

## 2021-10-07 DIAGNOSIS — L821 Other seborrheic keratosis: Secondary | ICD-10-CM | POA: Diagnosis not present

## 2021-10-07 DIAGNOSIS — L720 Epidermal cyst: Secondary | ICD-10-CM | POA: Diagnosis not present

## 2021-10-07 DIAGNOSIS — L905 Scar conditions and fibrosis of skin: Secondary | ICD-10-CM | POA: Diagnosis not present

## 2021-10-07 DIAGNOSIS — D229 Melanocytic nevi, unspecified: Secondary | ICD-10-CM | POA: Diagnosis not present

## 2022-02-21 DIAGNOSIS — L255 Unspecified contact dermatitis due to plants, except food: Secondary | ICD-10-CM | POA: Diagnosis not present

## 2022-02-21 DIAGNOSIS — H00015 Hordeolum externum left lower eyelid: Secondary | ICD-10-CM | POA: Diagnosis not present

## 2022-02-21 DIAGNOSIS — S60051A Contusion of right little finger without damage to nail, initial encounter: Secondary | ICD-10-CM | POA: Diagnosis not present

## 2022-02-21 DIAGNOSIS — S60946A Unspecified superficial injury of right little finger, initial encounter: Secondary | ICD-10-CM | POA: Diagnosis not present

## 2022-03-09 ENCOUNTER — Ambulatory Visit (INDEPENDENT_AMBULATORY_CARE_PROVIDER_SITE_OTHER): Payer: BC Managed Care – PPO | Admitting: Family Medicine

## 2022-03-09 ENCOUNTER — Encounter: Payer: Self-pay | Admitting: Family Medicine

## 2022-03-09 VITALS — BP 124/78 | HR 57 | Temp 97.8°F | Ht 68.0 in | Wt 222.4 lb

## 2022-03-09 DIAGNOSIS — M722 Plantar fascial fibromatosis: Secondary | ICD-10-CM | POA: Diagnosis not present

## 2022-03-09 DIAGNOSIS — K219 Gastro-esophageal reflux disease without esophagitis: Secondary | ICD-10-CM | POA: Diagnosis not present

## 2022-03-09 DIAGNOSIS — Z13220 Encounter for screening for lipoid disorders: Secondary | ICD-10-CM

## 2022-03-09 DIAGNOSIS — Z720 Tobacco use: Secondary | ICD-10-CM

## 2022-03-09 DIAGNOSIS — Z125 Encounter for screening for malignant neoplasm of prostate: Secondary | ICD-10-CM

## 2022-03-09 DIAGNOSIS — Z1159 Encounter for screening for other viral diseases: Secondary | ICD-10-CM | POA: Diagnosis not present

## 2022-03-09 NOTE — Progress Notes (Signed)
[facility] [address] [place] [address] Dept: [phone] Dept Fax: [phone]  New Patient Office Visit  Subjective:    Patient ID: Barry Jackson, male    DOB: 18-May-1966, 56 y.o..   MRN: [id number]  Chief Complaint  Patient presents with   Establish Care    NP-establish care. No concerns.  Not fasting today.     History of Present Illness:  Patient is in today to establish care. Barry Jackson was born in [place], [place]. He completed one year of general studies at [employer], then got an associates degree from [employer]. 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 He has been married for 26 years. He has one daughter (9), who recently graduated from [employer]. He admits to smoking ~ 1/4 ppd of cigarettes. He has not considered stopping. He drinks alcohol on the weekends. He denies any drug use.  Barry Jackson has a history of GERD. He was on Nexium at one point, but now manages this with routine use of omeprazole.  Barry Jackson notes that he has been having trouble with pain int he sole of his right foot. This hurts worst when he takes the first few steps of the morning and does flare after sitting for a while.  Past Medical History: Patient Active Problem List   Diagnosis Date Noted   Chondromalacia patellae, left knee 03/27/2021   Impingement syndrome of right shoulder 03/27/2021   GERD (gastroesophageal reflux disease) 10/10/2013   Past Surgical History:  Procedure Laterality Date   ESOPHAGOGASTRODUODENOSCOPY     TONSILLECTOMY AND ADENOIDECTOMY  ~ 1978   Family History  Problem Relation Age of Onset   Cancer Maternal Uncle        Head & Neck cancer   Heart disease Maternal Grandmother    Diabetes Maternal Grandmother    Heart disease Maternal Grandfather    Cancer Paternal Grandfather        Leukemia   Outpatient Medications Prior to Visit  Medication Sig Dispense Refill    ibuprofen (ADVIL,MOTRIN) 200 MG tablet Take 400-800 mg by mouth every 4 (four) hours as needed for moderate pain.     omeprazole (PRILOSEC) 20 MG capsule Take 20 mg by mouth daily.     triamcinolone cream (KENALOG) 0.1 % SMARTSIG:Pledget(s) Topical Twice Daily     albuterol (PROVENTIL HFA;VENTOLIN HFA) 108 (90 BASE) MCG/ACT inhaler Inhale 2 puffs into the lungs every 4 (four) hours as needed for wheezing or shortness of breath (cough, shortness of breath or wheezing.). 1 Inhaler 1   ALPRAZolam (XANAX) 0.25 MG tablet Take 1 tablet (0.25 mg total) by mouth 2 (two) times daily as needed for anxiety. 15 tablet 0   No facility-administered medications prior to visit.   No Known Allergies    Objective:   Today's Vitals   03/09/22 1433  BP: 124/78  Pulse: (!) 57  Temp: 97.8 F (36.6 C)  TempSrc: Temporal  SpO2: 96%  Weight: 222 lb 6.4 oz (100.9 kg)  Height: 5\' 8"  (1.727 m)   Body mass index is 33.82 kg/m.   General: Well developed, well nourished. No acute distress. Foot: Pain noted at the plantar aspect of the heel near the calcaneus. No tenderness to the Achilles   tendon or along the medial joint line. Psych: Alert and oriented. Normal mood and affect.  Health Maintenance Due  Topic Date Due   HIV Screening  Never done   Hepatitis C Screening  Never done   TETANUS/TDAP  Never done   Zoster Vaccines- Shingrix (1 of 2) Never done     Assessment & Plan:   1. Gastroesophageal reflux disease without esophagitis Stable on omeprazole 20 mg daily.  2. Plantar fasciitis of right foot We discussed the diagnosis, natural history of PF, and approaches to treatment. I recommend he do heel stretches, ice massage of the plantar fascia, and take naproxen. If pain worsesn, would consider referral to podiatry.  3. Encounter for hepatitis C screening test for low risk patient  - HCV Ab w Reflex to Quant PCR; Future  4. Screening for lipid disorders  - Lipid panel; Future  5. Screening  for prostate cancer  - PSA; Future  Return in about 1 year (around 03/10/2023) for Annual preventative care.   Loyola Mast, MD

## 2022-03-11 ENCOUNTER — Other Ambulatory Visit (INDEPENDENT_AMBULATORY_CARE_PROVIDER_SITE_OTHER): Payer: BC Managed Care – PPO

## 2022-03-11 DIAGNOSIS — Z125 Encounter for screening for malignant neoplasm of prostate: Secondary | ICD-10-CM

## 2022-03-11 DIAGNOSIS — Z13220 Encounter for screening for lipoid disorders: Secondary | ICD-10-CM | POA: Diagnosis not present

## 2022-03-11 DIAGNOSIS — Z1159 Encounter for screening for other viral diseases: Secondary | ICD-10-CM

## 2022-03-11 LAB — LIPID PANEL
Cholesterol: 244 mg/dL — ABNORMAL HIGH (ref 0–200)
HDL: 42.2 mg/dL (ref >39.00)
LDL Cholesterol: 169 mg/dL — ABNORMAL HIGH (ref 0–99)
NonHDL: 202.02
Total CHOL/HDL Ratio: 6
Triglycerides: 164 mg/dL — ABNORMAL HIGH (ref 0.0–149.0)
VLDL: 32.8 mg/dL (ref 0.0–40.0)

## 2022-03-11 LAB — PSA: PSA: 0.68 ng/mL (ref 0.10–4.00)

## 2022-10-27 DIAGNOSIS — L309 Dermatitis, unspecified: Secondary | ICD-10-CM | POA: Diagnosis not present

## 2022-10-27 DIAGNOSIS — L2089 Other atopic dermatitis: Secondary | ICD-10-CM | POA: Diagnosis not present

## 2022-11-04 DIAGNOSIS — J069 Acute upper respiratory infection, unspecified: Secondary | ICD-10-CM | POA: Diagnosis not present

## 2022-11-04 DIAGNOSIS — J029 Acute pharyngitis, unspecified: Secondary | ICD-10-CM | POA: Diagnosis not present

## 2022-11-04 DIAGNOSIS — J209 Acute bronchitis, unspecified: Secondary | ICD-10-CM | POA: Diagnosis not present

## 2022-11-04 DIAGNOSIS — R051 Acute cough: Secondary | ICD-10-CM | POA: Diagnosis not present

## 2023-06-09 ENCOUNTER — Encounter: Payer: BC Managed Care – PPO | Admitting: Family Medicine

## 2023-06-14 ENCOUNTER — Encounter: Payer: Self-pay | Admitting: Family Medicine

## 2023-06-14 ENCOUNTER — Ambulatory Visit (INDEPENDENT_AMBULATORY_CARE_PROVIDER_SITE_OTHER): Payer: BC Managed Care – PPO | Admitting: Family Medicine

## 2023-06-14 VITALS — BP 132/78 | HR 61 | Temp 98.3°F | Ht 68.0 in | Wt 222.2 lb

## 2023-06-14 DIAGNOSIS — M7542 Impingement syndrome of left shoulder: Secondary | ICD-10-CM | POA: Diagnosis not present

## 2023-06-14 DIAGNOSIS — Z23 Encounter for immunization: Secondary | ICD-10-CM | POA: Diagnosis not present

## 2023-06-14 DIAGNOSIS — E782 Mixed hyperlipidemia: Secondary | ICD-10-CM | POA: Diagnosis not present

## 2023-06-14 DIAGNOSIS — E785 Hyperlipidemia, unspecified: Secondary | ICD-10-CM | POA: Insufficient documentation

## 2023-06-14 DIAGNOSIS — Z0001 Encounter for general adult medical examination with abnormal findings: Secondary | ICD-10-CM | POA: Diagnosis not present

## 2023-06-14 DIAGNOSIS — Z0000 Encounter for general adult medical examination without abnormal findings: Secondary | ICD-10-CM

## 2023-06-14 DIAGNOSIS — Z125 Encounter for screening for malignant neoplasm of prostate: Secondary | ICD-10-CM

## 2023-06-14 DIAGNOSIS — Z720 Tobacco use: Secondary | ICD-10-CM

## 2023-06-14 HISTORY — DX: Hyperlipidemia, unspecified: E78.5

## 2023-06-14 NOTE — Assessment & Plan Note (Signed)
I continue to urge smoking cessation. At current levels of smoking, medication should not be necessary.

## 2023-06-14 NOTE — Assessment & Plan Note (Signed)
ACC/AHA cardiovascular risk score shows a 17.4 % (intermediate) risk of a MI or stroke in the next 10 years. I recommend eating a heart-healthy diet (DASH diet or Mediterranean), getting 150 minutes of moderate-intensity exercise each week, maintaining a normal weight, and avoiding tobacco products. Due to risk, I recommend we obtain a CT Coronary Artery Calcium scan to determine if statins are indicated.

## 2023-06-14 NOTE — Progress Notes (Signed)
[facility] [address] [place] [address] Dept: [phone] Dept Fax: [phone]  Annual Physical Visit  Subjective:    Patient ID: Barry Jackson, male    DOB: 03-07-1966, 57 y.o..   MRN: [id number]  Chief Complaint  Patient presents with   Annual Exam    CPE/labs.  Fasting today.   No concerns.    History of Present Illness:  Patient is in today for an annual physical/preventative visit.  Review of Systems  Constitutional:  Negative for chills, diaphoresis, fever, malaise/fatigue and weight loss.  HENT:  Negative for congestion, ear pain, hearing loss, sinus pain, sore throat and tinnitus.   Eyes:  Negative for blurred vision, pain, discharge and redness.  Respiratory:  Negative for cough, shortness of breath and wheezing.   Cardiovascular:  Positive for chest pain. Negative for palpitations.       Has some rare episodes of left chest discomfort. This last for the day, when it occurs and is the same with activity or rest. He does regular weight lifting, which seems ot exacerbate this at times. He has not dyspnea or cough.  Gastrointestinal:  Negative for abdominal pain, constipation, diarrhea, heartburn, nausea and vomiting.  Musculoskeletal:  Positive for joint pain. Negative for back pain and myalgias.       The left shoudler has been intermittently painful. This is worse with certain activities and movements, such as weight lifting.  Skin:  Negative for itching and rash.  Psychiatric/Behavioral:  Negative for depression. The patient is not nervous/anxious.    Past Medical History: Patient Active Problem List   Diagnosis Date Noted   Impingement syndrome of left shoulder 06/14/2023   Hyperlipidemia 06/14/2023   Tobacco use 03/09/2022   Chondromalacia patellae, left knee 03/27/2021   Impingement syndrome of right shoulder 03/27/2021   GERD (gastroesophageal reflux disease) 10/10/2013   Past Surgical History:   Procedure Laterality Date   ESOPHAGOGASTRODUODENOSCOPY     TONSILLECTOMY AND ADENOIDECTOMY  ~ 1978   Family History  Problem Relation Age of Onset   Cancer Maternal Uncle        Head & Neck cancer   Heart disease Maternal Grandmother    Diabetes Maternal Grandmother    Heart disease Maternal Grandfather    Cancer Paternal Grandfather        Leukemia   Outpatient Medications Prior to Visit  Medication Sig Dispense Refill   ibuprofen (ADVIL,MOTRIN) 200 MG tablet Take 400-800 mg by mouth every 4 (four) hours as needed for moderate pain.     omeprazole (PRILOSEC) 20 MG capsule Take 20 mg by mouth daily.     triamcinolone cream (KENALOG) 0.1 % SMARTSIG:Pledget(s) Topical Twice Daily     No facility-administered medications prior to visit.   No Known Allergies Objective:   Today's Vitals   06/14/23 1014  BP: 132/78  Pulse: 61  Temp: 98.3 F (36.8 C)  TempSrc: Temporal  SpO2: 99%  Weight: 222 lb 3.2 oz (100.8 kg)  Height: 5\' 8"  (1.727 m)   Body mass index is 33.79 kg/m.   General: Well developed, well nourished. No acute distress. HEENT: Normocephalic, non-traumatic. PERRL, EOMI. Conjunctiva clear. External ears normal. EAC and TMs normal bilaterally.   Nose clear without congestion or rhinorrhea. Mucous membranes moist. Oropharynx clear. Good dentition. Neck: Supple. No lymphadenopathy. No thyromegaly. Lungs: Clear to auscultation bilaterally. No wheezing, rales or rhonchi. CV: RRR without murmurs or rubs. Pulses 2+ bilaterally. Abdomen: Soft, non-tender. Bowel sounds positive,  normal pitch and frequency. No hepatosplenomegaly. No rebound or   guarding. Extremities: Full ROM. No joint swelling. Mild tenderness along the anterior left glenohumeral joint. Some increased pain with the   empty can test, but no weakness.  Skin: Warm and dry. No rashes. Psych: Alert and oriented. Normal mood and affect.  Health Maintenance Due  Topic Date Due   HIV Screening  Never done    DTaP/Tdap/Td (1 - Tdap) Never done   Zoster Vaccines- Shingrix (1 of 2) Never done     Assessment & Plan:   Problem List Items Addressed This Visit       Musculoskeletal and Integument   Impingement syndrome of left shoulder    Agree with use of Voltaren gel twice a day. Recommend relative rest and use of heat. If this is not improving, I would consider a steroid injection.        Other   Hyperlipidemia    ACC/AHA cardiovascular risk score shows a 17.4 % (intermediate) risk of a MI or stroke in the next 10 years. I recommend eating a heart-healthy diet (DASH diet or Mediterranean), getting 150 minutes of moderate-intensity exercise each week, maintaining a normal weight, and avoiding tobacco products. Due to risk, I recommend we obtain a CT Coronary Artery Calcium scan to determine if statins are indicated.        Relevant Orders   CT CARDIAC SCORING (SELF PAY ONLY)   Lipid panel   Tobacco use    I continue to urge smoking cessation. At current levels of smoking, medication should not be necessary.      Other Visit Diagnoses     Annual physical exam    -  Primary   Overall health is good. Discussed recommended screenings and immunizations.   Screening for prostate cancer       Relevant Orders   PSA   Need for Tdap vaccination       Relevant Orders   Tdap vaccine greater than or equal to 7yo IM (Completed)       Return in about 1 year (around 06/13/2024) for Annual preventative care.   Loyola Mast, MD

## 2023-06-14 NOTE — Assessment & Plan Note (Signed)
Agree with use of Voltaren gel twice a day. Recommend relative rest and use of heat. If this is not improving, I would consider a steroid injection.

## 2023-06-17 ENCOUNTER — Other Ambulatory Visit (INDEPENDENT_AMBULATORY_CARE_PROVIDER_SITE_OTHER): Payer: BC Managed Care – PPO

## 2023-06-17 DIAGNOSIS — E782 Mixed hyperlipidemia: Secondary | ICD-10-CM

## 2023-06-17 DIAGNOSIS — Z125 Encounter for screening for malignant neoplasm of prostate: Secondary | ICD-10-CM

## 2023-06-17 LAB — LIPID PANEL
Cholesterol: 234 mg/dL — ABNORMAL HIGH (ref 0–200)
HDL: 43.4 mg/dL (ref >39.00)
LDL Cholesterol: 163 mg/dL — ABNORMAL HIGH (ref 0–99)
NonHDL: 190.57
Total CHOL/HDL Ratio: 5
Triglycerides: 138 mg/dL (ref 0.0–149.0)
VLDL: 27.6 mg/dL (ref 0.0–40.0)

## 2023-06-17 LAB — PSA: PSA: 0.96 ng/mL (ref 0.10–4.00)

## 2023-07-06 ENCOUNTER — Ambulatory Visit (HOSPITAL_BASED_OUTPATIENT_CLINIC_OR_DEPARTMENT_OTHER)
Admission: RE | Admit: 2023-07-06 | Discharge: 2023-07-06 | Disposition: A | Discharge status: Home / Self Care | Payer: BC Managed Care – PPO | Source: Ambulatory Visit | Attending: Family Medicine | Admitting: Family Medicine

## 2023-07-06 DIAGNOSIS — E782 Mixed hyperlipidemia: Secondary | ICD-10-CM | POA: Insufficient documentation

## 2023-09-23 DIAGNOSIS — J189 Pneumonia, unspecified organism: Secondary | ICD-10-CM | POA: Diagnosis not present

## 2023-09-23 DIAGNOSIS — R051 Acute cough: Secondary | ICD-10-CM | POA: Diagnosis not present

## 2023-09-23 DIAGNOSIS — R5381 Other malaise: Secondary | ICD-10-CM | POA: Diagnosis not present

## 2024-06-15 ENCOUNTER — Encounter: Payer: BC Managed Care – PPO | Admitting: Family Medicine

## 2024-06-29 ENCOUNTER — Ambulatory Visit: Admitting: Family Medicine

## 2024-06-29 ENCOUNTER — Encounter: Payer: Self-pay | Admitting: Family Medicine

## 2024-06-29 VITALS — BP 120/78 | HR 55 | Temp 97.6°F | Ht 68.0 in | Wt 220.2 lb

## 2024-06-29 DIAGNOSIS — Z23 Encounter for immunization: Secondary | ICD-10-CM | POA: Diagnosis not present

## 2024-06-29 DIAGNOSIS — E782 Mixed hyperlipidemia: Secondary | ICD-10-CM | POA: Diagnosis not present

## 2024-06-29 DIAGNOSIS — Z720 Tobacco use: Secondary | ICD-10-CM

## 2024-06-29 DIAGNOSIS — K219 Gastro-esophageal reflux disease without esophagitis: Secondary | ICD-10-CM | POA: Diagnosis not present

## 2024-06-29 DIAGNOSIS — R911 Solitary pulmonary nodule: Secondary | ICD-10-CM

## 2024-06-29 DIAGNOSIS — Z0000 Encounter for general adult medical examination without abnormal findings: Secondary | ICD-10-CM | POA: Diagnosis not present

## 2024-06-29 NOTE — Assessment & Plan Note (Signed)
 I continue to urge smoking cessation. At current levels of smoking, medication should not be necessary. Barry Jackson is not ready at this point to quit.

## 2024-06-29 NOTE — Assessment & Plan Note (Signed)
 Prior CT showed a 3 mm nodule in 06/2023. In light of smoking history, I do feel a follow-up CT would be indicated.

## 2024-06-29 NOTE — Assessment & Plan Note (Signed)
 Overall health is good. Recommend ongoing regular exercise. Discussed recommended screenings and immunizations. PSA normal over last 2 years. We will hold off on repeat testing this year. Will move ahead with pneumococcal and zoster vaccination.

## 2024-06-29 NOTE — Assessment & Plan Note (Signed)
 ACC/AHA cardiovascular risk score last year showed a 17.4 % (intermediate) risk of a MI or stroke in the next 10 years. CT Coronary Artery Calcium score was zero. I recommend eating a heart-healthy diet (DASH diet or Mediterranean), getting 150 minutes of moderate-intensity exercise each week, maintaining a normal weight, and avoiding tobacco products. I do not see the need presently to recheck the lipids this year.

## 2024-06-29 NOTE — Assessment & Plan Note (Signed)
 Stable.  Continue omeprazole 20mg  daily

## 2024-06-29 NOTE — Progress Notes (Signed)
 [facility] PR[facility] [address] [place] [address] Dept: [phone] Dept Fax: [phone]  Annual Physical Visit  Subjective:    Patient ID: Barry Jackson, male    DOB: 1966-08-16, 58 y.o..   MRN: [id number]  Chief Complaint  Patient presents with   Annual Exam    CPE/labs.  No concerns.  Fasting today. Declines flu shot.    History of Present Illness:  Patient is in today for an annual physical/preventative visit.  Mr. Harrison is doing a combination of cardio and weight training 2 days a week. He also golfs weekly, walkignt he first 9 holes.  Review of Systems  Constitutional:  Negative for chills, diaphoresis, fever, malaise/fatigue and weight loss.  HENT:  Negative for congestion, ear pain, hearing loss, sinus pain, sore throat and tinnitus.   Eyes:  Negative for blurred vision, pain, discharge and redness.  Respiratory:  Negative for cough, shortness of breath and wheezing.   Cardiovascular:  Negative for chest pain and palpitations.  Gastrointestinal:  Positive for heartburn. Negative for abdominal pain, constipation, diarrhea, nausea and vomiting.       History of GERD. Takes omeprazole daily.  Genitourinary:        Has some dribbling after urination. Rare to have hesitancy. Urinates 0-1 times a night.  Musculoskeletal:  Negative for back pain, joint pain and myalgias.  Skin:  Negative for itching and rash.  Psychiatric/Behavioral:  Negative for depression. The patient is not nervous/anxious.    Past Medical History: Patient Active Problem List   Diagnosis Date Noted   Pulmonary nodule less than 6 mm determined by computed tomography of lung 06/29/2024   Hyperlipidemia 06/14/2023   Tobacco use 03/09/2022   GERD (gastroesophageal reflux disease) 10/10/2013   Past Surgical History:  Procedure Laterality Date   ESOPHAGOGASTRODUODENOSCOPY     TONSILLECTOMY AND ADENOIDECTOMY  ~ 1978   Family History  Problem  Relation Age of Onset   Cancer Father    Cancer Maternal Uncle        Head & Neck cancer   Heart disease Maternal Grandmother    Diabetes Maternal Grandmother    Heart disease Maternal Grandfather    Cancer Paternal Grandfather        Leukemia   Outpatient Medications Prior to Visit  Medication Sig Dispense Refill   ibuprofen (ADVIL,MOTRIN) 200 MG tablet Take 400-800 mg by mouth every 4 (four) hours as needed for moderate pain.     omeprazole (PRILOSEC) 20 MG capsule Take 20 mg by mouth daily.     triamcinolone  cream (KENALOG) 0.1 % SMARTSIG:Pledget(s) Topical Twice Daily (Patient not taking: Reported on 06/29/2024)     No facility-administered medications prior to visit.   No Known Allergies Objective:   Today's Vitals   06/29/24 0821  BP: 120/78  Pulse: (!) 55  Temp: 97.6 F (36.4 C)  TempSrc: Temporal  SpO2: 99%  Weight: 220 lb 3.2 oz (99.9 kg)  Height: 5' 8 (1.727 m)   Body mass index is 33.48 kg/m.   General: Well developed, well nourished. No acute distress. HEENT: Normocephalic, non-traumatic. PERRL, EOMI. Conjunctiva clear. External ears normal. EAC and TMs normal   bilaterally. Nose clear without congestion or rhinorrhea. Mucous membranes moist. Oropharynx clear. Good dentition. Neck: Supple. No lymphadenopathy. No thyromegaly. Lungs: Clear to auscultation bilaterally. No wheezing, rales or rhonchi. CV: RRR without murmurs or rubs. Pulses 2+ bilaterally. Abdomen: Soft, non-tender. Bowel sounds positive, normal pitch and frequency. No hepatosplenomegaly. No rebound  or   guarding. Extremities: Full ROM. No joint swelling or tenderness. No edema noted. Skin: Warm and dry. No rashes. Psych: Alert and oriented. Normal mood and affect.  Health Maintenance Due  Topic Date Due   HIV Screening  Never done   Pneumococcal Vaccine: 50+ Years (1 of 2 - PCV) Never done   Hepatitis B Vaccines 19-59 Average Risk (1 of 3 - 19+ 3-dose series) Never done   Zoster Vaccines-  Shingrix (1 of 2) Never done     Assessment & Plan:   Problem List Items Addressed This Visit       Respiratory   Pulmonary nodule less than 6 mm determined by computed tomography of lung   Prior CT showed a 3 mm nodule in 06/2023. In light of smoking history, I do feel a follow-up CT would be indicated.      Relevant Orders   CT Chest Wo Contrast     Digestive   GERD (gastroesophageal reflux disease) (Chronic)   Stable. Continue omeprazole 20 mg daily.        Other   Annual physical exam - Primary   Overall health is good. Recommend ongoing regular exercise. Discussed recommended screenings and immunizations. PSA normal over last 2 years. We will hold off on repeat testing this year. Will move ahead with pneumococcal and zoster vaccination.       Hyperlipidemia   ACC/AHA cardiovascular risk score last year showed a 17.4 % (intermediate) risk of a MI or stroke in the next 10 years. CT Coronary Artery Calcium score was zero. I recommend eating a heart-healthy diet (DASH diet or Mediterranean), getting 150 minutes of moderate-intensity exercise each week, maintaining a normal weight, and avoiding tobacco products. I do not see the need presently to recheck the lipids this year.       Tobacco use   I continue to urge smoking cessation. At current levels of smoking, medication should not be necessary. Mr. Manor is not ready at this point to quit.      Other Visit Diagnoses       Need for pneumococcal 20-valent conjugate vaccination       Relevant Orders   Pneumococcal conjugate vaccine 20-valent     Need for shingles vaccine       Relevant Orders   Varicella-zoster vaccine IM       Return in about 1 year (around 06/29/2025) for Annual preventative care.   Garnette CHRISTELLA Simpler, MD

## 2024-07-04 NOTE — Addendum Note (Signed)
 Addended by: THEDORA GARNETTE HERO on: 07/04/2024 05:28 PM   Modules accepted: Orders

## 2024-07-13 ENCOUNTER — Ambulatory Visit (HOSPITAL_BASED_OUTPATIENT_CLINIC_OR_DEPARTMENT_OTHER)

## 2024-08-03 ENCOUNTER — Telehealth: Payer: Self-pay | Admitting: Family Medicine

## 2024-08-03 NOTE — Telephone Encounter (Signed)
 Copied from CRM #[id number]. Topic: General - Call Back - No Documentation >> Aug 03, 2024 11:26 AM Rea BROCKS wrote: Reason for CRM: Patient called in and stated that he's made two attempts already to be scheduled for his CT Scan at the imaging center but it keeps getting cancelled. Patient noticed that the cancel reason given states provider and nothing else. Patient is asking is it a code error or he if the office can attempt to get him scheduled again for the CT Scan.    [phone] (M)

## 2024-08-04 ENCOUNTER — Ambulatory Visit (HOSPITAL_BASED_OUTPATIENT_CLINIC_OR_DEPARTMENT_OTHER)

## 2024-08-04 NOTE — Telephone Encounter (Signed)
 Communication for the CT scan is on the referral.

## 2024-08-15 ENCOUNTER — Ambulatory Visit
Admission: RE | Admit: 2024-08-15 | Discharge: 2024-08-15 | Disposition: A | Discharge status: Home / Self Care | Source: Ambulatory Visit | Attending: Family Medicine | Admitting: Family Medicine

## 2024-08-15 DIAGNOSIS — R911 Solitary pulmonary nodule: Secondary | ICD-10-CM

## 2024-08-25 ENCOUNTER — Ambulatory Visit: Payer: Self-pay | Admitting: Family Medicine

## 2024-09-05 ENCOUNTER — Ambulatory Visit (INDEPENDENT_AMBULATORY_CARE_PROVIDER_SITE_OTHER)

## 2024-09-05 DIAGNOSIS — Z23 Encounter for immunization: Secondary | ICD-10-CM | POA: Diagnosis not present

## 2024-09-05 NOTE — Progress Notes (Signed)
.  Patient is in office today for a nurse visit for 2nd Shingles Vaccine. Patient Injection was given in the  Right deltoid. Patient tolerated injection well.

## 2025-07-03 ENCOUNTER — Encounter: Admitting: Family Medicine
